# Patient Record
Sex: Female | Born: 2000 | Race: Black or African American | Hispanic: No | Smoking: Former smoker
Health system: Southern US, Community
[De-identification: ages and names within clinical notes are randomized; demographics above are authoritative.]

## PROBLEM LIST (undated history)

## (undated) DIAGNOSIS — D649 Anemia, unspecified: Secondary | ICD-10-CM

## (undated) HISTORY — DX: Anemia, unspecified: D64.9

---

## 2019-07-19 ENCOUNTER — Other Ambulatory Visit: Payer: Self-pay

## 2019-07-19 ENCOUNTER — Encounter: Payer: Self-pay | Admitting: Emergency Medicine

## 2019-07-19 ENCOUNTER — Ambulatory Visit
Admission: EM | Admit: 2019-07-19 | Discharge: 2019-07-19 | Disposition: A | Discharge status: Home / Self Care | Payer: Self-pay | Attending: Physician Assistant | Admitting: Physician Assistant

## 2019-07-19 DIAGNOSIS — R05 Cough: Secondary | ICD-10-CM | POA: Insufficient documentation

## 2019-07-19 DIAGNOSIS — R059 Cough, unspecified: Secondary | ICD-10-CM

## 2019-07-19 DIAGNOSIS — F17200 Nicotine dependence, unspecified, uncomplicated: Secondary | ICD-10-CM

## 2019-07-19 DIAGNOSIS — J029 Acute pharyngitis, unspecified: Secondary | ICD-10-CM | POA: Insufficient documentation

## 2019-07-19 DIAGNOSIS — Z20822 Contact with and (suspected) exposure to covid-19: Secondary | ICD-10-CM

## 2019-07-19 LAB — POCT RAPID STREP A (OFFICE): Rapid Strep A Screen: NEGATIVE

## 2019-07-19 MED ORDER — BENZONATATE 200 MG PO CAPS: 200.0000 mg | ORAL_CAPSULE | Freq: Three times a day (TID) | ORAL | 0 refills | Status: DC

## 2019-07-19 MED ORDER — IPRATROPIUM BROMIDE 0.06 % NA SOLN: 2.0000 | Freq: Four times a day (QID) | NASAL | 0 refills | Status: DC

## 2019-07-19 NOTE — ED Triage Notes (Signed)
Pt here for sore throat and body aches with cough x 5 days; denies fever

## 2019-07-19 NOTE — Discharge Instructions (Signed)
COVID PCR testing ordered. I would like you to quarantine until testing results. You can take over the counter flonase/nasacort to help with nasal congestion/drainage. Tylenol/motrin for pain and fever. Keep hydrated, urine should be clear to pale yellow in color. If experiencing shortness of breath, trouble breathing, go to the emergency department for further evaluation needed.  

## 2019-07-19 NOTE — ED Provider Notes (Signed)
[facility]    CSN: [id number] Arrival date & time: 07/19/19  1130      History   Chief Complaint Chief Complaint  Patient presents with  . Sore Throat    HPI Mindy Key is a 19 y.o. female.   19 year old female comes in for 5 day of URI symptoms. Sore throat, body aches, cough. Cough was at first nonproductive, has been productive for the past 2 days. Denies fever, chills. Denies abdominal pain, nausea, vomiting, diarrhea. Denies shortness of breath, loss of taste/smell. otc cold medicine with some relief. Current some day smoker, 1 black and mile. No history of inhaler use.      History reviewed. No pertinent past medical history.  There are no problems to display for this patient.   History reviewed. No pertinent surgical history.  OB History   No obstetric history on file.      Home Medications    Prior to Admission medications   Medication Sig Start Date End Date Taking? Authorizing Provider  benzonatate (TESSALON) 200 MG capsule Take 1 capsule (200 mg total) by mouth every 8 (eight) hours. 07/19/19   Tasia Catchings, Ramari Bray V, PA-C  ipratropium (ATROVENT) 0.06 % nasal spray Place 2 sprays into both nostrils 4 (four) times daily. 07/19/19   Ok Edwards, PA-C    Family History Family History  Problem Relation Age of Onset  . Healthy Mother   . Healthy Father     Social History Social History   Tobacco Use  . Smoking status: Current Every Day Smoker  . Smokeless tobacco: Never Used  Substance Use Topics  . Alcohol use: Yes  . Drug use: Never     Allergies   Patient has no known allergies.   Review of Systems Review of Systems  Reason unable to perform ROS: See HPI as above.     Physical Exam Triage Vital Signs ED Triage Vitals  Enc Vitals Group     BP 07/19/19 1219 103/63     Pulse Rate 07/19/19 1219 73     Resp 07/19/19 1219 18     Temp 07/19/19 1219 98.1 F (36.7 C)     Temp Source 07/19/19 1219 Oral     SpO2 07/19/19 1219 100 %   Weight --      Height --      Head Circumference --      Peak Flow --      Pain Score 07/19/19 1220 5     Pain Loc --      Pain Edu? --      Excl. in [place]? --    No data found.  Updated Vital Signs BP 103/63 (BP Location: Right Arm)   Pulse 73   Temp 98.1 F (36.7 C) (Oral)   Resp 18   SpO2 100%   Physical Exam Constitutional:      General: She is not in acute distress.    Appearance: Normal appearance. She is not ill-appearing, toxic-appearing or diaphoretic.  HENT:     Head: Normocephalic and atraumatic.     Right Ear: Tympanic membrane and ear canal normal. Tympanic membrane is not erythematous or bulging.     Left Ear: Tympanic membrane and ear canal normal. Tympanic membrane is not erythematous or bulging.     Nose:     Right Sinus: No maxillary sinus tenderness or frontal sinus tenderness.     Left Sinus: No maxillary sinus tenderness or frontal sinus tenderness.  Mouth/Throat:     Mouth: Mucous membranes are moist.     Pharynx: Oropharynx is clear. Uvula midline.     Tonsils: 2+ on the right. 2+ on the left.  Cardiovascular:     Rate and Rhythm: Normal rate and regular rhythm.     Heart sounds: Normal heart sounds. No murmur. No friction rub. No gallop.   Pulmonary:     Effort: Pulmonary effort is normal. No accessory muscle usage, prolonged expiration, respiratory distress or retractions.     Comments: Lungs clear to auscultation without adventitious lung sounds. Musculoskeletal:     Cervical back: Normal range of motion and neck supple.  Neurological:     General: No focal deficit present.     Mental Status: She is alert and oriented to person, place, and time.      UC Treatments / Results  Labs (all labs ordered are listed, but only abnormal results are displayed) Labs Reviewed  POCT RAPID STREP A (OFFICE) - Normal  NOVEL CORONAVIRUS, NAA  CULTURE, GROUP A STREP [facility])    EKG   Radiology No results found.  Procedures Procedures (including  critical care time)  Medications Ordered in UC Medications - No data to display  Initial Impression / Assessment and Plan / UC Course  I have reviewed the triage vital signs and the nursing notes.  Pertinent labs & imaging results that were available during my care of the patient were reviewed by me and considered in my medical decision making (see chart for details).    Rapid strep negative. COVID PCR test ordered. Patient to quarantine until testing results return. No alarming signs on exam.  Patient speaking in full sentences without respiratory distress. LCTAB. Symptomatic treatment discussed.  Push fluids.  Return precautions given.  Patient expresses understanding and agrees to plan.  Given smoking history, now with worsening cough, if symptomatic treatment not improving, can do short course prednisone if needed.  Final Clinical Impressions(s) / UC Diagnoses   Final diagnoses:  Cough  Sore throat    ED Prescriptions    Medication Sig Dispense Auth. Provider   benzonatate (TESSALON) 200 MG capsule Take 1 capsule (200 mg total) by mouth every 8 (eight) hours. 21 capsule Anastasio Wogan V, PA-C   ipratropium (ATROVENT) 0.06 % nasal spray Place 2 sprays into both nostrils 4 (four) times daily. 15 mL Belinda Fisher, PA-C     PDMP not reviewed this encounter.   Belinda Fisher, PA-C 07/19/19 1237

## 2019-07-20 LAB — NOVEL CORONAVIRUS, NAA: SARS-CoV-2, NAA: NOT DETECTED

## 2019-07-22 LAB — CULTURE, GROUP A STREP (THRC)

## 2021-01-31 ENCOUNTER — Ambulatory Visit: Payer: Medicaid Other | Admitting: Obstetrics & Gynecology

## 2021-02-11 ENCOUNTER — Encounter (HOSPITAL_COMMUNITY): Payer: Self-pay | Admitting: Emergency Medicine

## 2021-02-11 ENCOUNTER — Other Ambulatory Visit: Payer: Self-pay

## 2021-02-11 ENCOUNTER — Emergency Department (HOSPITAL_COMMUNITY)
Admission: EM | Admit: 2021-02-11 | Discharge: 2021-02-11 | Disposition: A | Discharge status: Home / Self Care | Payer: Medicaid Other | Attending: Emergency Medicine | Admitting: Emergency Medicine

## 2021-02-11 DIAGNOSIS — F17200 Nicotine dependence, unspecified, uncomplicated: Secondary | ICD-10-CM | POA: Insufficient documentation

## 2021-02-11 DIAGNOSIS — L0291 Cutaneous abscess, unspecified: Secondary | ICD-10-CM

## 2021-02-11 DIAGNOSIS — L02212 Cutaneous abscess of back [any part, except buttock]: Secondary | ICD-10-CM | POA: Insufficient documentation

## 2021-02-11 LAB — POC URINE PREG, ED: Preg Test, Ur: NEGATIVE

## 2021-02-11 MED ORDER — AMOXICILLIN-POT CLAVULANATE 875-125 MG PO TABS: 1.0000 | ORAL_TABLET | Freq: Two times a day (BID) | ORAL | 0 refills | Status: AC

## 2021-02-11 NOTE — ED Triage Notes (Signed)
Pt c/o low back pain and "knots". Denies fever.

## 2021-02-11 NOTE — ED Provider Notes (Signed)
[facility] EMERGENCY DEPARTMENT Provider Note   CSN: [id number] Arrival date & time: 02/11/21  1446     History Chief Complaint  Patient presents with   Back Pain    Mindy Key is a 20 y.o. female.  This is a 20 y.o. female without significant medical history who presents to the ED with complaint of back pain.   Location:  left of midline to lumbar spine Duration:  1 week Onset:  at rest Timing:  constant, mildly worsened Description:  sharp, aching Severity:  mild Exacerbating/Alleviating Factors:  direct palpation, squeezing of lesion Associated Symptoms:  none Pertinent Negatives:  no fevers, chills, nausea or emesis, no change to bowel or bladder activity, no gait disturbances, no IVDU. Not a/w trauma. No numbness or weakness. No saddle paresthesias or urinary overflow/bowel incontinence. No fresh water exposure.   She also reports squeezing the lesion 1-2 days ago and noticed some mild purulent drainage, minimal bleeding.    The history is provided by the patient. No language interpreter was used.  Back Pain Associated symptoms: no abdominal pain, no chest pain, no fever and no headaches       History reviewed. No pertinent past medical history.  There are no problems to display for this patient.   History reviewed. No pertinent surgical history.   OB History   No obstetric history on file.     Family History  Problem Relation Age of Onset   Healthy Mother    Healthy Father     Social History   Tobacco Use   Smoking status: Every Day   Smokeless tobacco: Never  Substance Use Topics   Alcohol use: Yes   Drug use: Never    Home Medications Prior to Admission medications   Medication Sig Start Date End Date Taking? Authorizing Provider  amoxicillin-clavulanate (AUGMENTIN) 875-125 MG tablet Take 1 tablet by mouth every 12 (twelve) hours for 7 days. 02/11/21 02/18/21 Yes Sloan Leiter, DO  benzonatate (TESSALON) 200 MG capsule  Take 1 capsule (200 mg total) by mouth every 8 (eight) hours. 07/19/19   Cathie Hoops, Amy V, PA-C  ipratropium (ATROVENT) 0.06 % nasal spray Place 2 sprays into both nostrils 4 (four) times daily. 07/19/19   Belinda Fisher, PA-C    Allergies    Patient has no known allergies.  Review of Systems   Review of Systems  Constitutional:  Negative for chills and fever.  HENT:  Negative for facial swelling and trouble swallowing.   Eyes:  Negative for photophobia and visual disturbance.  Respiratory:  Negative for cough and shortness of breath.   Cardiovascular:  Negative for chest pain and palpitations.  Gastrointestinal:  Negative for abdominal pain, nausea and vomiting.  Endocrine: Negative for polydipsia and polyuria.  Genitourinary:  Negative for difficulty urinating and hematuria.  Musculoskeletal:  Positive for back pain. Negative for gait problem and joint swelling.  Skin:  Positive for wound. Negative for pallor and rash.  Neurological:  Negative for syncope and headaches.  Psychiatric/Behavioral:  Negative for agitation and confusion.    Physical Exam Updated Vital Signs BP 119/70 (BP Location: Left Arm)   Pulse 84   Temp 98.5 F (36.9 C) (Oral)   Resp 16   SpO2 97%   Physical Exam Vitals and nursing note reviewed.  Constitutional:      General: She is not in acute distress.    Appearance: Normal appearance.  HENT:     Head: Normocephalic and atraumatic.  Right Ear: External ear normal.     Left Ear: External ear normal.     Nose: Nose normal.     Mouth/Throat:     Mouth: Mucous membranes are moist.  Eyes:     General: No scleral icterus.       Right eye: No discharge.        Left eye: No discharge.     Extraocular Movements: Extraocular movements intact.     Pupils: Pupils are equal, round, and reactive to light.  Cardiovascular:     Rate and Rhythm: Normal rate and regular rhythm.     Pulses: Normal pulses.     Heart sounds: Normal heart sounds.  Pulmonary:     Effort:  Pulmonary effort is normal. No respiratory distress.     Breath sounds: Normal breath sounds.  Abdominal:     General: Abdomen is flat.     Tenderness: There is no abdominal tenderness.  Musculoskeletal:        General: Normal range of motion.     Cervical back: Normal range of motion.       Back:     Right lower leg: No edema.     Left lower leg: No edema.  Skin:    General: Skin is warm and dry.     Capillary Refill: Capillary refill takes less than 2 seconds.  Neurological:     Mental Status: She is alert and oriented to person, place, and time.     GCS: GCS eye subscore is 4. GCS verbal subscore is 5. GCS motor subscore is 6.     Cranial Nerves: Cranial nerves are intact. No cranial nerve deficit.     Sensory: Sensation is intact.     Motor: Motor function is intact. No tremor.     Coordination: Coordination is intact.     Gait: Gait is intact.  Psychiatric:        Mood and Affect: Mood normal.        Behavior: Behavior normal.    ED Results / Procedures / Treatments   Labs (all labs ordered are listed, but only abnormal results are displayed) Labs Reviewed  POC URINE PREG, ED    EKG None  Radiology No results found.  Procedures Procedures   Medications Ordered in ED Medications - No data to display  ED Course  I have reviewed the triage vital signs and the nursing notes.  Pertinent labs & imaging results that were available during my care of the patient were reviewed by me and considered in my medical decision making (see chart for details).    MDM Rules/Calculators/A&P                            Patient presents with an abscess, located on the lower back. Vital signs reviewed and are stable. Afebrile. Neuro exam is non-focal. Abscess is not midline and does not track towards the spine. Appears more superficial with minimal area of fluctuance surrounding the draining abscess. No cellulitic changes, no streaking from the wound. No IVDU. No hx spinal  injections. No reported injury to her lower back. Neurological exam is non-focal. Serious etiology was considered.  As wound already draining in the ED will not perform I&D at this time. Patient was instructed on importance of Abx and the need for a wound check either with PCP or to return to ED for reevaluation of wound. Patient told keep wound clean and dry  for the next 48 hours.   The patient has been instructed to return immediately if the symptoms worsen in any way for re-evaluation. Patient verbalized understanding and is in agreement with current care plan. All questions answered prior to discharge.   Final Clinical Impression(s) / ED Diagnoses Final diagnoses:  Abscess    Rx / DC Orders ED Discharge Orders          Ordered    POCT urine pregnancy        02/11/21 1635    amoxicillin-clavulanate (AUGMENTIN) 875-125 MG tablet  Every 12 hours        02/11/21 1744             Sloan Leiter, DO 02/11/21 1751

## 2021-02-11 NOTE — Discharge Instructions (Addendum)
Please take tylenol/motrin every 4-6 hours as needed for discomfort.

## 2021-02-23 ENCOUNTER — Encounter (HOSPITAL_COMMUNITY): Payer: Self-pay | Admitting: Emergency Medicine

## 2021-02-23 ENCOUNTER — Emergency Department (HOSPITAL_COMMUNITY)
Admission: EM | Admit: 2021-02-23 | Discharge: 2021-02-23 | Disposition: A | Discharge status: Home / Self Care | Payer: Medicaid Other | Attending: Emergency Medicine | Admitting: Emergency Medicine

## 2021-02-23 ENCOUNTER — Other Ambulatory Visit: Payer: Self-pay

## 2021-02-23 DIAGNOSIS — L292 Pruritus vulvae: Secondary | ICD-10-CM | POA: Insufficient documentation

## 2021-02-23 DIAGNOSIS — F17200 Nicotine dependence, unspecified, uncomplicated: Secondary | ICD-10-CM | POA: Insufficient documentation

## 2021-02-23 DIAGNOSIS — N898 Other specified noninflammatory disorders of vagina: Secondary | ICD-10-CM

## 2021-02-23 LAB — WET PREP, GENITAL
Clue Cells Wet Prep HPF POC: NONE SEEN
Sperm: NONE SEEN
Trich, Wet Prep: NONE SEEN
Yeast Wet Prep HPF POC: NONE SEEN

## 2021-02-23 LAB — URINALYSIS, ROUTINE W REFLEX MICROSCOPIC
Bilirubin Urine: NEGATIVE
Glucose, UA: NEGATIVE mg/dL
Ketones, ur: NEGATIVE mg/dL
Nitrite: NEGATIVE
Protein, ur: NEGATIVE mg/dL
Specific Gravity, Urine: 1.01 (ref 1.005–1.030)
pH: 6 (ref 5.0–8.0)

## 2021-02-23 LAB — PREGNANCY, URINE: Preg Test, Ur: NEGATIVE

## 2021-02-23 MED ORDER — CEFTRIAXONE SODIUM 500 MG IJ SOLR
500.0000 mg | Freq: Once | INTRAMUSCULAR | Status: AC
  Administered 2021-02-23: 500 mg via INTRAMUSCULAR
  Filled 2021-02-23: qty 500

## 2021-02-23 MED ORDER — LIDOCAINE HCL (PF) 1 % IJ SOLN
1.0000 mL | Freq: Once | INTRAMUSCULAR | Status: AC
  Administered 2021-02-23: 1 mL

## 2021-02-23 MED ORDER — DOXYCYCLINE HYCLATE 100 MG PO TABS: 100.0000 mg | ORAL_TABLET | Freq: Two times a day (BID) | ORAL | 0 refills | Status: DC

## 2021-02-23 MED ORDER — METRONIDAZOLE 500 MG PO TABS: 500.0000 mg | ORAL_TABLET | Freq: Two times a day (BID) | ORAL | 0 refills | Status: DC

## 2021-02-23 NOTE — ED Triage Notes (Signed)
Patient reports vaginal irritation with urinary frequency onset last week , denies vaginal discharge or fever .

## 2021-02-23 NOTE — Discharge Instructions (Addendum)
Please return if your symptoms worsen or fail to improve. I would refrain from sexual activity until your symptoms have fully resolved or until you have completed your antibiotics. Please finish your entire course of antibiotics.  You can check the results of your STI screen on your portal in around 24 hours.

## 2021-02-23 NOTE — ED Provider Notes (Signed)
[facility] EMERGENCY DEPARTMENT Provider Note   CSN: [id number] Arrival date & time: 02/23/21  0210     History Chief Complaint  Patient presents with   Urinary Frequency / Vaginal Irritation     Mindy Key is a 20 y.o. female significant for previous treatment for trichomoniasis and chlamydia who presents with 1 week of vaginal irritation, without discharge.  Patient reports that she is sexually active with 1 partner, has been sexually active with 1 partner for the last 3 months.  Patient denies dyspareunia.  Patient denies any foul odor.  Patient reports that her last menstrual period was this week, she just finished.  Patient denies gross hematuria in urine.  Patient denies history of nephrolithiasis.  Patient denies fever, constipation, diarrhea, abdominal discomfort, nausea or vomiting.  Patient reports her sexual partner has not had any symptoms, has not been tested recently.  HPI     History reviewed. No pertinent past medical history.  There are no problems to display for this patient.   History reviewed. No pertinent surgical history.   OB History   No obstetric history on file.     Family History  Problem Relation Age of Onset   Healthy Mother    Healthy Father     Social History   Tobacco Use   Smoking status: Every Day   Smokeless tobacco: Never  Substance Use Topics   Alcohol use: Yes   Drug use: Never    Home Medications Prior to Admission medications   Medication Sig Start Date End Date Taking? Authorizing Provider  doxycycline (VIBRA-TABS) 100 MG tablet Take 1 tablet (100 mg total) by mouth 2 (two) times daily. 02/23/21  Yes Jettie Lazare H, PA-C  metroNIDAZOLE (FLAGYL) 500 MG tablet Take 1 tablet (500 mg total) by mouth 2 (two) times daily. 02/23/21  Yes Satchel Heidinger H, PA-C  benzonatate (TESSALON) 200 MG capsule Take 1 capsule (200 mg total) by mouth every 8 (eight) hours. 07/19/19   Cathie Hoops, Amy V, PA-C  ipratropium  (ATROVENT) 0.06 % nasal spray Place 2 sprays into both nostrils 4 (four) times daily. 07/19/19   Belinda Fisher, PA-C    Allergies    Patient has no known allergies.  Review of Systems   Review of Systems  Genitourinary:        Vaginal itching  All other systems reviewed and are negative.  Physical Exam Updated Vital Signs BP (!) 117/57 (BP Location: Right Arm)   Pulse 71   Temp 98.7 F (37.1 C) (Oral)   Resp 15   Ht 5\' 3"  (1.6 m)   Wt 75 kg   LMP 02/18/2021   SpO2 100%   BMI 29.29 kg/m   Physical Exam Vitals and nursing note reviewed.  Constitutional:      General: She is not in acute distress.    Appearance: Normal appearance.  HENT:     Head: Normocephalic and atraumatic.  Eyes:     General:        Right eye: No discharge.        Left eye: No discharge.  Cardiovascular:     Rate and Rhythm: Normal rate and regular rhythm.  Pulmonary:     Effort: Pulmonary effort is normal. No respiratory distress.  Abdominal:     Tenderness: There is no abdominal tenderness.     Comments: No RUQ tenderness  Genitourinary:    General: Normal vulva.     Comments: Some whitish discharge from multiparous cervical  os, no lesions noted on Os or in vaginal vault. CMT on bimanual. No masses palpated. Musculoskeletal:        General: No deformity.  Skin:    General: Skin is warm and dry.  Neurological:     Mental Status: She is alert and oriented to person, place, and time.  Psychiatric:        Mood and Affect: Mood normal.        Behavior: Behavior normal.    ED Results / Procedures / Treatments   Labs (all labs ordered are listed, but only abnormal results are displayed) Labs Reviewed  WET PREP, GENITAL - Abnormal; Notable for the following components:      Result Value   WBC, Wet Prep HPF POC MANY (*)    All other components within normal limits  URINALYSIS, ROUTINE W REFLEX MICROSCOPIC - Abnormal; Notable for the following components:   APPearance HAZY (*)    Hgb urine  dipstick MODERATE (*)    Leukocytes,Ua MODERATE (*)    Bacteria, UA RARE (*)    All other components within normal limits  PREGNANCY, URINE  GC/CHLAMYDIA PROBE AMP ([place]) NOT AT [facility]    EKG None  Radiology No results found.  Procedures Procedures   Medications Ordered in ED Medications  cefTRIAXone (ROCEPHIN) injection 500 mg (500 mg Intramuscular Given 02/23/21 1429)  lidocaine (PF) (XYLOCAINE) 1 % injection 1 mL (1 mL Other Given 02/23/21 1429)    ED Course  I have reviewed the triage vital signs and the nursing notes.  Pertinent labs & imaging results that were available during my care of the patient were reviewed by me and considered in my medical decision making (see chart for details).    MDM Rules/Calculators/A&P                         Patient with history of chlamydia, trichomoniasis previously treated.  Patient with new vaginal itching, some bacteria and leukocytes in urinalysis, and CMT on bimanual exam.  Cervical discharge is not clearly consistent with chlamydia or gonorrhea.  Had discussion with patient on whether to wait for results or treat presumptively given CMT for chlamydia, gonorrhea at this time.  Patient would like to treat presumptively at this time. Wet prep shows WBCs. Patient has no right upper quadrant tenderness, minimal clinical concern for Fitz-Hugh Curtis syndrome.  Patient has no adnexal tenderness, no fever, no signs of sepsis or systemic infection, minimal clinical concern for tubo-ovarian abscess at this time.  Patient discharged in stable condition.  Return precautions were given.  Patient educated to hold off on sexual practices until symptoms resolve or until antibiotics completely finished. Final Clinical Impression(s) / ED Diagnoses Final diagnoses:  Vaginal itching    Rx / DC Orders ED Discharge Orders          Ordered    doxycycline (VIBRA-TABS) 100 MG tablet  2 times daily        02/23/21 1412    metroNIDAZOLE (FLAGYL) 500  MG tablet  2 times daily        02/23/21 1412             Woodward Klem, [place] H, PA-C 02/23/21 1439    Alvira Monday, MD 02/25/21 [phone]

## 2021-02-23 NOTE — ED Provider Notes (Signed)
Emergency Medicine Provider Triage Evaluation Note  Mindy Key , a 20 y.o. female  was evaluated in triage.  Pt complains of vaginal irritation x 1 week. Reports itchiness & irritation with urinary frequency. No alleviating/aggravating factors.   Review of Systems  Positive: Vaginal irritation/pruritus, urinary frequency Negative: Dysuria, fever, vomiting, abdominal pain, vaginal bleeding/discharge.   Physical Exam  Ht 5\' 3"  (1.6 m)   Wt 75 kg   LMP 02/18/2021   BMI 29.29 kg/m  Gen:   Awake, no distress   Resp:  Normal effort  MSK:   Moves extremities without difficulty  Other:  Abdomen nontender. GU exam deferred in triage setting.   Medical Decision Making  Medically screening exam initiated at 2:43 AM.  Appropriate orders placed.  [name] was informed that the remainder of the evaluation will be completed by another provider, this initial triage assessment does not replace that evaluation, and the importance of remaining in the ED until their evaluation is complete.  Vaginal irritation.    [name], PA-C 02/23/21 0245    02/25/21, MD 02/23/21 [phone]

## 2021-02-24 LAB — GC/CHLAMYDIA PROBE AMP (~~LOC~~) NOT AT ARMC
Chlamydia: NEGATIVE
Comment: NEGATIVE
Comment: NORMAL
Neisseria Gonorrhea: NEGATIVE

## 2021-06-02 ENCOUNTER — Ambulatory Visit (HOSPITAL_COMMUNITY)
Admission: EM | Admit: 2021-06-02 | Discharge: 2021-06-02 | Disposition: A | Discharge status: Home / Self Care | Payer: Medicaid Other | Attending: Emergency Medicine | Admitting: Emergency Medicine

## 2021-06-02 ENCOUNTER — Other Ambulatory Visit: Payer: Self-pay

## 2021-06-02 ENCOUNTER — Encounter (HOSPITAL_COMMUNITY): Payer: Self-pay | Admitting: Emergency Medicine

## 2021-06-02 DIAGNOSIS — N898 Other specified noninflammatory disorders of vagina: Secondary | ICD-10-CM | POA: Diagnosis present

## 2021-06-02 DIAGNOSIS — R590 Localized enlarged lymph nodes: Secondary | ICD-10-CM | POA: Insufficient documentation

## 2021-06-02 MED ORDER — METHYLPREDNISOLONE SODIUM SUCC 125 MG IJ SOLR
INTRAMUSCULAR | Status: AC
  Filled 2021-06-02: qty 2

## 2021-06-02 MED ORDER — LIDOCAINE VISCOUS HCL 2 % MT SOLN: 15.0000 mL | OROMUCOSAL | 0 refills | Status: DC | PRN

## 2021-06-02 MED ORDER — FLUCONAZOLE 150 MG PO TABS: 150.0000 mg | ORAL_TABLET | Freq: Every day | ORAL | 0 refills | Status: AC

## 2021-06-02 MED ORDER — IBUPROFEN 800 MG PO TABS: 800.0000 mg | ORAL_TABLET | Freq: Three times a day (TID) | ORAL | 0 refills | Status: DC

## 2021-06-02 MED ORDER — METHYLPREDNISOLONE SODIUM SUCC 125 MG IJ SOLR
60.0000 mg | Freq: Once | INTRAMUSCULAR | Status: AC
  Administered 2021-06-02: 60 mg via INTRAMUSCULAR

## 2021-06-02 NOTE — ED Provider Notes (Signed)
[facility]    CSN: [id number] Arrival date & time: 06/02/21  1355      History   Chief Complaint Chief Complaint  Patient presents with   Neck Pain    HPI Mindy Key is a 21 y.o. female.   Patient concerned with knot in the left side of neck for 4 days.  Endorses it has fluctuated in size.  Site is painful.  Denies drainage, fever, chills, nasal congestion, rhinorrhea, sore throat, difficulty swallowing, itchy throat.  Has not attempted treatment of symptoms.  Concerned with Taevin Mcferran thick vaginal discharge with associated itching and irritation for 5 days.  Has not attempted treatment for symptoms.  No known exposure.  Sexually active, 1 partner, no condom use.  Denies vaginal odor, urinary frequency or urgency, dysuria, hematuria, abdominal pain, flank pain.   History reviewed. No pertinent past medical history.  There are no problems to display for this patient.   History reviewed. No pertinent surgical history.  OB History   No obstetric history on file.      Home Medications    Prior to Admission medications   Medication Sig Start Date End Date Taking? Authorizing Provider  benzonatate (TESSALON) 200 MG capsule Take 1 capsule (200 mg total) by mouth every 8 (eight) hours. 07/19/19   Cathie Hoops, Amy V, PA-C  doxycycline (VIBRA-TABS) 100 MG tablet Take 1 tablet (100 mg total) by mouth 2 (two) times daily. 02/23/21   Prosperi, Christian H, PA-C  ipratropium (ATROVENT) 0.06 % nasal spray Place 2 sprays into both nostrils 4 (four) times daily. 07/19/19   Belinda Fisher, PA-C    Family History Family History  Problem Relation Age of Onset   Healthy Mother    Healthy Father     Social History Social History   Tobacco Use   Smoking status: Every Day   Smokeless tobacco: Never  Substance Use Topics   Alcohol use: Yes   Drug use: Never     Allergies   Patient has no known allergies.   Review of Systems Review of Systems  Constitutional: Negative.   HENT:  Negative.    Genitourinary:  Positive for vaginal discharge. Negative for decreased urine volume, difficulty urinating, dyspareunia, dysuria, enuresis, flank pain, frequency, genital sores, hematuria, menstrual problem, pelvic pain, urgency, vaginal bleeding and vaginal pain.  Musculoskeletal:  Positive for neck pain. Negative for arthralgias, back pain, gait problem, joint swelling, myalgias and neck stiffness.  Skin: Negative.     Physical Exam Triage Vital Signs ED Triage Vitals  Enc Vitals Group     BP 06/02/21 1512 101/67     Pulse Rate 06/02/21 1512 82     Resp 06/02/21 1512 17     Temp 06/02/21 1512 98.3 F (36.8 C)     Temp Source 06/02/21 1512 Oral     SpO2 06/02/21 1512 99 %     Weight --      Height --      Head Circumference --      Peak Flow --      Pain Score 06/02/21 1509 8     Pain Loc --      Pain Edu? --      Excl. in GC? --    No data found.  Updated Vital Signs BP 101/67    Pulse 82    Temp 98.3 F (36.8 C) (Oral)    Resp 17    SpO2 99%   Visual Acuity Right Eye Distance:  Left Eye Distance:   Bilateral Distance:    Right Eye Near:   Left Eye Near:    Bilateral Near:     Physical Exam Constitutional:      Appearance: Normal appearance. She is normal weight.  HENT:     Mouth/Throat:     Pharynx: Oropharynx is clear. Uvula midline. No oropharyngeal exudate or posterior oropharyngeal erythema.     Tonsils: No tonsillar exudate. 2+ on the right. 2+ on the left.  Eyes:     Extraocular Movements: Extraocular movements intact.  Pulmonary:     Effort: Pulmonary effort is normal.  Genitourinary:    Comments: Deferred self collect vaginal swab Lymphadenopathy:     Cervical: Cervical adenopathy present.     Left cervical: Posterior cervical adenopathy present. No superficial or deep cervical adenopathy.  Skin:    General: Skin is warm and dry.  Neurological:     Mental Status: She is alert and oriented to person, place, and time. Mental status is  at baseline.  Psychiatric:        Mood and Affect: Mood normal.        Behavior: Behavior normal.     UC Treatments / Results  Labs (all labs ordered are listed, but only abnormal results are displayed) Labs Reviewed  CERVICOVAGINAL ANCILLARY ONLY    EKG   Radiology No results found.  Procedures Procedures (including critical care time)  Medications Ordered in UC Medications - No data to display  Initial Impression / Assessment and Plan / UC Course  I have reviewed the triage vital signs and the nursing notes.  Pertinent labs & imaging results that were available during my care of the patient were reviewed by me and considered in my medical decision making (see chart for details).  Posterior cervical adenopathy Vaginal discharge  1.  Methylprednisolone 60 mg IM now to help reduce swelling, ibuprofen 800 mg 3 times daily as needed, lidocaine viscous 2% 15 mils every 4 hours as needed, to monitor site, may return for persistent symptoms 2.  Symptomology is consistent with a yeast infection, will prescribe Diflucan, STI screening pending, will treat per protocol, advised abstinence until labs result, treatment is complete and all symptoms have resolved, advised condom use moving forward after all sexual encounters, UC follow up as needed Final Clinical Impressions(s) / UC Diagnoses   Final diagnoses:  None   Discharge Instructions   None    ED Prescriptions   None    PDMP not reviewed this encounter.   Hans Eden, NP 06/02/21 1538

## 2021-06-02 NOTE — ED Triage Notes (Signed)
Pt c/o knot on left side of neck for couple days that is painful.  Vaginal discharge and irritation for a couple days. Discharge is white.

## 2021-06-02 NOTE — Discharge Instructions (Signed)
The area of concern on your neck appears to be a lymph node which is swollen, this typically will resolve itself, you are being given an injection here in the office to help reduce swelling  In addition you may use ibuprofen every 8 hours as needed to help with the pain this medicine will also help to reduce swelling  You may gargle and spit lidocaine solution every 4 hours as needed for temporary relief  You may hold warm compresses to the affected area for comfort  Your vaginal symptoms today are consistent with yeast, take 1 Diflucan pill then in 3 days if still having symptoms may take second dose  Labs pending 2-3 days, you will be contacted if positive for any sti and treatment will be sent to the pharmacy, you will have to return to the clinic if positive for gonorrhea to receive treatment   Please refrain from having sex until labs results, if positive please refrain from having sex until treatment complete and symptoms resolve   If positive for Chlamydia  gonorrhea or trichomoniasis please notify partner or partners so they may tested as well  Moving forward, it is recommended you use some form of protection against the transmission of sti infections  such as condoms or dental dams with each sexual encounter

## 2021-06-05 ENCOUNTER — Telehealth (HOSPITAL_COMMUNITY): Payer: Self-pay | Admitting: Emergency Medicine

## 2021-06-05 LAB — CERVICOVAGINAL ANCILLARY ONLY
Bacterial Vaginitis (gardnerella): POSITIVE — AB
Candida Glabrata: NEGATIVE
Candida Vaginitis: POSITIVE — AB
Chlamydia: NEGATIVE
Comment: NEGATIVE
Comment: NEGATIVE
Comment: NEGATIVE
Comment: NEGATIVE
Comment: NEGATIVE
Comment: NORMAL
Neisseria Gonorrhea: NEGATIVE
Trichomonas: NEGATIVE

## 2021-06-05 MED ORDER — METRONIDAZOLE 500 MG PO TABS: 500.0000 mg | ORAL_TABLET | Freq: Two times a day (BID) | ORAL | 0 refills | Status: DC

## 2021-06-09 ENCOUNTER — Encounter (HOSPITAL_COMMUNITY): Payer: Self-pay

## 2021-06-09 ENCOUNTER — Ambulatory Visit (HOSPITAL_COMMUNITY)
Admission: EM | Admit: 2021-06-09 | Discharge: 2021-06-09 | Disposition: A | Discharge status: Home / Self Care | Payer: Medicaid Other | Attending: Urgent Care | Admitting: Urgent Care

## 2021-06-09 ENCOUNTER — Other Ambulatory Visit: Payer: Self-pay

## 2021-06-09 DIAGNOSIS — B9689 Other specified bacterial agents as the cause of diseases classified elsewhere: Secondary | ICD-10-CM | POA: Diagnosis not present

## 2021-06-09 DIAGNOSIS — Z3201 Encounter for pregnancy test, result positive: Secondary | ICD-10-CM

## 2021-06-09 DIAGNOSIS — L03317 Cellulitis of buttock: Secondary | ICD-10-CM

## 2021-06-09 DIAGNOSIS — N760 Acute vaginitis: Secondary | ICD-10-CM

## 2021-06-09 LAB — POCT URINALYSIS DIPSTICK, ED / UC
Glucose, UA: NEGATIVE mg/dL
Ketones, ur: 40 mg/dL — AB
Nitrite: NEGATIVE
Protein, ur: 30 mg/dL — AB
Specific Gravity, Urine: >=1.03 (ref 1.005–1.030)
Urobilinogen, UA: 4 mg/dL — ABNORMAL HIGH (ref 0.0–1.0)
pH: 5.5 (ref 5.0–8.0)

## 2021-06-09 LAB — POC URINE PREG, ED: Preg Test, Ur: POSITIVE — AB

## 2021-06-09 MED ORDER — CLINDAMYCIN HCL 300 MG PO CAPS: 300.0000 mg | ORAL_CAPSULE | Freq: Three times a day (TID) | ORAL | 0 refills | Status: DC

## 2021-06-09 NOTE — ED Provider Notes (Signed)
[facility]   MRN: [id number] DOB: 2000/12/25  Subjective:   Mindy Key is a 21 y.o. female presenting for 3-day history of acute onset persistent and worsening right-sided buttock pain with swelling.  She would also like to be tested for pregnancy.  Reports that she took 3 pregnancy tests at home and all were positive.  She is concerned because she was recently prescribed antibiotics and does not know if she is safe to take them.  Denies fever, nausea, vomiting, dysuria, hematuria, vaginal bleeding, pelvic pain.  She did call and set up an OB appointment for next week.  She was last seen 06/05/2021.  Ultimately her tests from that day resulted in a bacterial vaginosis.  She was prescribed metronidazole which she has not taken out of fear for pregnancy.  No current facility-administered medications for this encounter.  Current Outpatient Medications:    benzonatate (TESSALON) 200 MG capsule, Take 1 capsule (200 mg total) by mouth every 8 (eight) hours., Disp: 21 capsule, Rfl: 0   doxycycline (VIBRA-TABS) 100 MG tablet, Take 1 tablet (100 mg total) by mouth 2 (two) times daily., Disp: 14 tablet, Rfl: 0   ibuprofen (ADVIL) 800 MG tablet, Take 1 tablet (800 mg total) by mouth 3 (three) times daily., Disp: 21 tablet, Rfl: 0   ipratropium (ATROVENT) 0.06 % nasal spray, Place 2 sprays into both nostrils 4 (four) times daily., Disp: 15 mL, Rfl: 0   lidocaine (XYLOCAINE) 2 % solution, Use as directed 15 mLs in the mouth or throat as needed for mouth pain., Disp: 100 mL, Rfl: 0   metroNIDAZOLE (FLAGYL) 500 MG tablet, Take 1 tablet (500 mg total) by mouth 2 (two) times daily., Disp: 14 tablet, Rfl: 0   No Known Allergies  History reviewed. No pertinent past medical history.   History reviewed. No pertinent surgical history.  Family History  Problem Relation Age of Onset   Healthy Mother    Healthy Father     Social History   Tobacco Use   Smoking status: Every Day    Smokeless tobacco: Never  Substance Use Topics   Alcohol use: Yes   Drug use: Never    ROS   Objective:   Vitals: BP 102/65 (BP Location: Left Arm)    Pulse 94    Temp 98.7 F (37.1 C) (Oral)    Resp 18    LMP 04/21/2021 (Exact Date)    SpO2 100%   Physical Exam Exam conducted with a chaperone present (CMA Adrianna).  Constitutional:      General: She is not in acute distress.    Appearance: Normal appearance. She is well-developed. She is not ill-appearing, toxic-appearing or diaphoretic.  HENT:     Head: Normocephalic and atraumatic.     Nose: Nose normal.     Mouth/Throat:     Mouth: Mucous membranes are moist.     Pharynx: Oropharynx is clear.  Eyes:     General: No scleral icterus.       Right eye: No discharge.        Left eye: No discharge.     Extraocular Movements: Extraocular movements intact.     Conjunctiva/sclera: Conjunctivae normal.     Pupils: Pupils are equal, round, and reactive to light.  Cardiovascular:     Rate and Rhythm: Normal rate.  Pulmonary:     Effort: Pulmonary effort is normal.  Abdominal:     General: Bowel sounds are normal. There is no distension.  Palpations: Abdomen is soft. There is no mass.     Tenderness: There is no abdominal tenderness. There is no right CVA tenderness, left CVA tenderness, guarding or rebound.  Genitourinary:   Skin:    General: Skin is warm and dry.  Neurological:     General: No focal deficit present.     Mental Status: She is alert and oriented to person, place, and time.  Psychiatric:        Mood and Affect: Mood normal.        Behavior: Behavior normal.        Thought Content: Thought content normal.        Judgment: Judgment normal.    Results for orders placed or performed during the hospital encounter of 06/09/21 (from the past 24 hour(s))  POC Urinalysis dipstick     Status: Abnormal   Collection Time: 06/09/21  3:51 PM  Result Value Ref Range   Glucose, UA NEGATIVE NEGATIVE mg/dL    Bilirubin Urine SMALL (A) NEGATIVE   Ketones, ur 40 (A) NEGATIVE mg/dL   Specific Gravity, Urine >=1.030 1.005 - 1.030   Hgb urine dipstick TRACE (A) NEGATIVE   pH 5.5 5.0 - 8.0   Protein, ur 30 (A) NEGATIVE mg/dL   Urobilinogen, UA 4.0 (H) 0.0 - 1.0 mg/dL   Nitrite NEGATIVE NEGATIVE   Leukocytes,Ua SMALL (A) NEGATIVE  POC urine pregnancy     Status: Abnormal   Collection Time: 06/09/21  3:56 PM  Result Value Ref Range   Preg Test, Ur POSITIVE (A) NEGATIVE    Assessment and Plan :   PDMP not reviewed this encounter.  1. Cellulitis of right buttock   2. Positive pregnancy test   3. Bacterial vaginosis    Offered incision and drainage but patient adamantly refused.  Counseled that if she has no improvement with antibiotics and warm compresses she would have to return and we would need to do an incision and drainage at that point.  She is to start clindamycin which is safe in pregnancy.  Advised that she start taking metronidazole to treat her bacterial vaginosis as this poses a risk for pregnancy.  Keep follow-up appointment with her OB.  Start prenatal vitamin. Counseled patient on potential for adverse effects with medications prescribed/recommended today, ER and return-to-clinic precautions discussed, patient verbalized understanding.    Jaynee Eagles, PA-C 06/09/21 1709

## 2021-06-09 NOTE — Discharge Instructions (Signed)
Please make sure you use Tylenol for your buttock pain.  Start clindamycin to address the cellulitis.  Apply warm compresses to the area.  If you change your mind about having a procedure done would like incision and drainage or if you worsen then please come back to our clinic.  Otherwise follow-up with your OB/GYN.  Make sure you finish the prescription for metronidazole as it is important that you get treated for bacterial vaginosis in pregnancy.

## 2021-06-09 NOTE — ED Triage Notes (Signed)
Pt presents with c/o a knot on her bottom x 3 days.   States she is pregnant and has concerns.

## 2021-06-13 ENCOUNTER — Ambulatory Visit: Payer: Medicaid Other

## 2021-06-13 ENCOUNTER — Other Ambulatory Visit: Payer: Self-pay

## 2021-06-22 ENCOUNTER — Other Ambulatory Visit: Payer: Self-pay

## 2021-06-22 ENCOUNTER — Inpatient Hospital Stay (HOSPITAL_COMMUNITY): Payer: Medicaid Other

## 2021-06-22 ENCOUNTER — Ambulatory Visit (INDEPENDENT_AMBULATORY_CARE_PROVIDER_SITE_OTHER): Payer: Self-pay

## 2021-06-22 ENCOUNTER — Inpatient Hospital Stay (HOSPITAL_COMMUNITY)
Admission: AD | Admit: 2021-06-22 | Discharge: 2021-06-22 | Disposition: A | Discharge status: Home / Self Care | Payer: Medicaid Other | Attending: Obstetrics and Gynecology | Admitting: Obstetrics and Gynecology

## 2021-06-22 ENCOUNTER — Encounter (HOSPITAL_COMMUNITY): Payer: Self-pay | Admitting: Obstetrics and Gynecology

## 2021-06-22 DIAGNOSIS — O99611 Diseases of the digestive system complicating pregnancy, first trimester: Secondary | ICD-10-CM | POA: Insufficient documentation

## 2021-06-22 DIAGNOSIS — O26891 Other specified pregnancy related conditions, first trimester: Secondary | ICD-10-CM | POA: Diagnosis not present

## 2021-06-22 DIAGNOSIS — O26899 Other specified pregnancy related conditions, unspecified trimester: Secondary | ICD-10-CM | POA: Diagnosis not present

## 2021-06-22 DIAGNOSIS — Z3491 Encounter for supervision of normal pregnancy, unspecified, first trimester: Secondary | ICD-10-CM

## 2021-06-22 DIAGNOSIS — R109 Unspecified abdominal pain: Secondary | ICD-10-CM | POA: Insufficient documentation

## 2021-06-22 DIAGNOSIS — K5900 Constipation, unspecified: Secondary | ICD-10-CM | POA: Insufficient documentation

## 2021-06-22 DIAGNOSIS — Z3A08 8 weeks gestation of pregnancy: Secondary | ICD-10-CM | POA: Insufficient documentation

## 2021-06-22 DIAGNOSIS — Z3480 Encounter for supervision of other normal pregnancy, unspecified trimester: Secondary | ICD-10-CM | POA: Insufficient documentation

## 2021-06-22 DIAGNOSIS — O3680X1 Pregnancy with inconclusive fetal viability, fetus 1: Secondary | ICD-10-CM

## 2021-06-22 LAB — URINALYSIS, ROUTINE W REFLEX MICROSCOPIC
Bilirubin Urine: NEGATIVE
Glucose, UA: NEGATIVE mg/dL
Hgb urine dipstick: NEGATIVE
Ketones, ur: 20 mg/dL — AB
Nitrite: NEGATIVE
Protein, ur: 30 mg/dL — AB
Specific Gravity, Urine: 1.026 (ref 1.005–1.030)
pH: 5 (ref 5.0–8.0)

## 2021-06-22 LAB — WET PREP, GENITAL
Clue Cells Wet Prep HPF POC: NONE SEEN
Sperm: NONE SEEN
Trich, Wet Prep: NONE SEEN
WBC, Wet Prep HPF POC: >=10 — AB (ref <10)
Yeast Wet Prep HPF POC: NONE SEEN

## 2021-06-22 LAB — HCG, QUANTITATIVE, PREGNANCY: hCG, Beta Chain, Quant, S: 56169 m[IU]/mL — ABNORMAL HIGH (ref <5)

## 2021-06-22 LAB — ABO/RH: ABO/RH(D): O POS

## 2021-06-22 LAB — CBC
HCT: 35 % — ABNORMAL LOW (ref 36.0–46.0)
Hemoglobin: 11.3 g/dL — ABNORMAL LOW (ref 12.0–15.0)
MCH: 24.8 pg — ABNORMAL LOW (ref 26.0–34.0)
MCHC: 32.3 g/dL (ref 30.0–36.0)
MCV: 76.8 fL — ABNORMAL LOW (ref 80.0–100.0)
Platelets: 318 10*3/uL (ref 150–400)
RBC: 4.56 MIL/uL (ref 3.87–5.11)
RDW: 17.9 % — ABNORMAL HIGH (ref 11.5–15.5)
WBC: 9.2 10*3/uL (ref 4.0–10.5)
nRBC: 0 % (ref 0.0–0.2)

## 2021-06-22 MED ORDER — DOCUSATE SODIUM 250 MG PO CAPS: 250.0000 mg | ORAL_CAPSULE | Freq: Two times a day (BID) | ORAL | 0 refills | Status: DC

## 2021-06-22 MED ORDER — TERCONAZOLE 0.4 % VA CREA: 1.0000 | TOPICAL_CREAM | Freq: Every day | VAGINAL | 0 refills | Status: DC

## 2021-06-22 NOTE — Progress Notes (Signed)
Pt states having a lot of soreness & pain in upper abdomen around rib area & lower abdomen, has been having pain for a while. Has taken Tylenol, states helps some. Spoke with Dr. Donavan Foil, went over symptoms, he suggested Pt to go to MAU to make sure that she is not having an Ectopic Pregnancy. Advised Pt. She verbalized understanding.

## 2021-06-22 NOTE — MAU Note (Signed)
Pt reports pain in her upper abd/ribs that is sharp also c/o cramping in lower stomach as well. Pain since Monday.  Denies any vag bleeding or discharge.

## 2021-06-22 NOTE — Discharge Instructions (Signed)

## 2021-06-22 NOTE — MAU Provider Note (Signed)
History     CSN: [id number]  Arrival date and time: 06/22/21 1617   Event Date/Time   First Provider Initiated Contact with Patient 06/22/21 1705      Chief Complaint  Patient presents with   Abdominal Pain   HPI Mindy Key is a 21 y.o. G3P1011 at [redacted]w[redacted]d who presents with abdominal pain. She reports the pain is all over her abdomen. She rates the pain a 6/10 and has not tried anything for the pain. She reports some "clumpy" white discharge that itches. She denies any bleeding.   OB History     Gravida  3   Para  1   Term  1   Preterm      AB  1   Living  1      SAB  1   IAB      Ectopic      Multiple      Live Births  1           History reviewed. No pertinent past medical history.  Past Surgical History:  Procedure Laterality Date   CESAREAN SECTION      Family History  Problem Relation Age of Onset   Healthy Mother    Healthy Father    Cancer Maternal Uncle     Social History   Tobacco Use   Smoking status: Former    Types: Cigarettes    Quit date: 04/05/2021    Years since quitting: 0.2   Smokeless tobacco: Never  Substance Use Topics   Alcohol use: Not Currently   Drug use: Never    Allergies: No Known Allergies  Medications Prior to Admission  Medication Sig Dispense Refill Last Dose   benzonatate (TESSALON) 200 MG capsule Take 1 capsule (200 mg total) by mouth every 8 (eight) hours. 21 capsule 0    clindamycin (CLEOCIN) 300 MG capsule Take 1 capsule (300 mg total) by mouth 3 (three) times daily. (Patient not taking: Reported on 06/22/2021) 30 capsule 0    ibuprofen (ADVIL) 800 MG tablet Take 1 tablet (800 mg total) by mouth 3 (three) times daily. (Patient not taking: Reported on 06/22/2021) 21 tablet 0    ipratropium (ATROVENT) 0.06 % nasal spray Place 2 sprays into both nostrils 4 (four) times daily. (Patient not taking: Reported on 06/22/2021) 15 mL 0    lidocaine (XYLOCAINE) 2 % solution Use as directed 15 mLs in the mouth or  throat as needed for mouth pain. (Patient not taking: Reported on 06/22/2021) 100 mL 0    metroNIDAZOLE (FLAGYL) 500 MG tablet Take 1 tablet (500 mg total) by mouth 2 (two) times daily. 14 tablet 0     Review of Systems  Constitutional: Negative.  Negative for fatigue and fever.  HENT: Negative.    Respiratory: Negative.  Negative for shortness of breath.   Cardiovascular: Negative.  Negative for chest pain.  Gastrointestinal:  Positive for abdominal pain. Negative for constipation, diarrhea, nausea and vomiting.  Genitourinary: Negative.  Negative for dysuria, vaginal bleeding and vaginal discharge.  Neurological: Negative.  Negative for dizziness and headaches.  Physical Exam   Blood pressure 125/79, pulse 95, temperature 99.4 F (37.4 C), resp. rate 18, height 5\' 3"  (1.6 m), weight 78.9 kg, last menstrual period 04/24/2021.  Physical Exam Vitals and nursing note reviewed.  Constitutional:      General: She is not in acute distress.    Appearance: She is well-developed.  HENT:     Head: Normocephalic.  Eyes:  Pupils: Pupils are equal, round, and reactive to light.  Cardiovascular:     Rate and Rhythm: Normal rate and regular rhythm.     Heart sounds: Normal heart sounds.  Pulmonary:     Effort: Pulmonary effort is normal. No respiratory distress.     Breath sounds: Normal breath sounds.  Abdominal:     General: Bowel sounds are normal. There is no distension.     Palpations: Abdomen is soft.     Tenderness: There is no abdominal tenderness.  Skin:    General: Skin is warm and dry.  Neurological:     Mental Status: She is alert and oriented to person, place, and time.  Psychiatric:        Mood and Affect: Mood normal.        Behavior: Behavior normal.        Thought Content: Thought content normal.        Judgment: Judgment normal.    MAU Course  Procedures Results for orders placed or performed during the hospital encounter of 06/22/21 (from the past 24 hour(s))   CBC     Status: Abnormal   Collection Time: 06/22/21  4:17 PM  Result Value Ref Range   WBC 9.2 4.0 - 10.5 K/uL   RBC 4.56 3.87 - 5.11 MIL/uL   Hemoglobin 11.3 (L) 12.0 - 15.0 g/dL   HCT 14.735.0 (L) 82.936.0 - 56.246.0 %   MCV 76.8 (L) 80.0 - 100.0 fL   MCH 24.8 (L) 26.0 - 34.0 pg   MCHC 32.3 30.0 - 36.0 g/dL   RDW 13.017.9 (H) 86.511.5 - 78.415.5 %   Platelets 318 150 - 400 K/uL   nRBC 0.0 0.0 - 0.2 %  ABO/Rh     Status: None   Collection Time: 06/22/21  4:45 PM  Result Value Ref Range   ABO/RH(D) O POS    No rh immune globuloin      NOT A RH IMMUNE GLOBULIN CANDIDATE, PT RH POSITIVE Performed at [facility], [address] [place], [place] [address]   hCG, quantitative, pregnancy     Status: Abnormal   Collection Time: 06/22/21  4:52 PM  Result Value Ref Range   hCG, Beta Chain, Quant, S 56,169 (H) <5 mIU/mL  Urinalysis, Routine w reflex microscopic Urine, Clean Catch     Status: Abnormal   Collection Time: 06/22/21  5:01 PM  Result Value Ref Range   Color, Urine AMBER (A) YELLOW   APPearance HAZY (A) CLEAR   Specific Gravity, Urine 1.026 1.005 - 1.030   pH 5.0 5.0 - 8.0   Glucose, UA NEGATIVE NEGATIVE mg/dL   Hgb urine dipstick NEGATIVE NEGATIVE   Bilirubin Urine NEGATIVE NEGATIVE   Ketones, ur 20 (A) NEGATIVE mg/dL   Protein, ur 30 (A) NEGATIVE mg/dL   Nitrite NEGATIVE NEGATIVE   Leukocytes,Ua LARGE (A) NEGATIVE   RBC / HPF 6-10 0 - 5 RBC/hpf   WBC, UA 11-20 0 - 5 WBC/hpf   Bacteria, UA FEW (A) NONE SEEN   Squamous Epithelial / LPF 0-5 0 - 5   Mucus PRESENT   Wet prep, genital     Status: Abnormal   Collection Time: 06/22/21  5:19 PM  Result Value Ref Range   Yeast Wet Prep HPF POC NONE SEEN NONE SEEN   Trich, Wet Prep NONE SEEN NONE SEEN   Clue Cells Wet Prep HPF POC NONE SEEN NONE SEEN   WBC, Wet Prep HPF POC >=10 (A) <10  Sperm NONE SEEN     US OB Comp Less 14 Wks  Result Date: 06/22/2021 CLINICAL DATA:  Epigastric pain, pelvic pain EXAM: OBSTETRIC <14 WK ULTRASOUND  TECHNIQUE: Transabdominal ultrasound was performed for evaluation of the gestation as well as the maternal uterus and adnexal regions. COMPARISON:  None. FINDINGS: Intrauterine gestational sac: Single Yolk sac:  Visualized. Embryo:  Visualized. Cardiac Activity: Visualized. Heart Rate: 169 bpm MSD:    mm    w     d CRL:   18.7 mm   8 w 2 d                  [place] EDC: 01/30/2022 Subchorionic hemorrhage:  None visualized. Maternal uterus/adnexae: No adnexal mass or free fluid. IMPRESSION: Eight week 2 day intrauterine pregnancy. Fetal heart rate 169 beats per minute. No acute maternal findings. Electronically Signed   By: Charlett Nose M.D.   On: 06/22/2021 18:29     MDM UA, UPT CBC, HCG ABO/Rh- O Pos Wet prep and gc/chlamydia US OB Comp Less 14 weeks with Transvaginal   Assessment and Plan   1. Abdominal pain affecting pregnancy   2. Normal intrauterine pregnancy on prenatal ultrasound in first trimester   3. [redacted] weeks gestation of pregnancy   4. Constipation during pregnancy in first trimester    -Discharge home in stable condition -Rx for terazol and colace sent to patient's pharmacy -First trimester precautions discussed -Patient advised to follow-up with OB as scheduled for prenatal care -Patient may return to MAU as needed or if her condition were to change or worsen   Rolm Bookbinder CNM 06/22/2021, 5:05 PM

## 2021-06-22 NOTE — Patient Instructions (Signed)
  At our [facility], we work as an integrated team, providing care to address both physical and emotional health. Your medical provider may refer you to see our Behavioral Health Clinician (BHC) on the same day you see your medical provider, as availability permits; often scheduled virtually at your convenience.  Our BHC is available to all patients, visits generally last between 20-30 minutes, but can be longer or shorter, depending on patient need. The BHC offers help with stress management, coping with symptoms of depression and anxiety, major life changes , sleep issues, changing risky behavior, grief and loss, life stress, working on personal life goals, and  behavioral health issues, as these all affect your overall health and wellness.  The BHC is NOT available for the following: FMLA paperwork, court-ordered evaluations, specialty assessments (custody or disability), letters to employers, or obtaining certification for an emotional support animal. The BHC does not provide long-term therapy. You have the right to refuse integrated behavioral health services, or to reschedule to see the BHC at a later date.  Confidentiality exception: If it is suspected that a child or disabled adult is being abused or neglected, we are required by law to report that to either Child Protective Services or Adult Protective Services.  If you have a diagnosis of Bipolar affective disorder, Schizophrenia, or recurrent Major depressive disorder, we will recommend that you establish care with a psychiatrist, as these are lifelong, chronic conditions, and we want your overall emotional health and medications to be more closely monitored. If you anticipate needing extended maternity leave due to mental health issues postpartum, it it recommended you inform your medical provider, so we can put in a referral to a psychiatrist as soon as possible. The BHC is unable to recommend an extended maternity leave for mental  health issues. Your medical provider or BHC may refer you to a therapist for ongoing, traditional therapy, or to a psychiatrist, for medication management, if it would benefit your overall health. Depending on your insurance, you may have a copay or be charged a deductible, depending on your insurance, to see the BHC. If you are uninsured, it is recommended that you apply for financial assistance. (Forms may be requested at the front desk for in-person visits, via MyChart, or request a form during a virtual visit).  If you see the BHC more than 6 times, you will have to complete a comprehensive clinical assessment interview with the BHC to resume integrated services.  For virtual visits with the BHC, you must be physically in the state of [place] at the time of the visit. For example, if you live in [place], you will have to do an in-person visit with the BHC, and your out-of-state insurance may not cover behavioral health services in [place]. If you are going out of the state or country for any reason, the BHC may see you virtually when you return to [place], but not while you are physically outside of [place].    

## 2021-06-22 NOTE — Progress Notes (Signed)
New OB Intake  I connected with  Mindy Key on 06/22/21 at  3:15 PM EST by In Person Visit and verified that I am speaking with the correct person using two identifiers. Nurse is located at [facility] and pt is located at [name].  I discussed the limitations, risks, security and privacy concerns of performing an evaluation and management service by telephone and the availability of in person appointments. I also discussed with the patient that there may be a patient responsible charge related to this service. The patient expressed understanding and agreed to proceed.  I explained I am completing New OB Intake today. We discussed her EDD of 01/29/22 that is based on LMP of 04/24/21. Pt is G3/P1. I reviewed her allergies, medications, Medical/Surgical/OB history, and appropriate screenings. I informed her of [facility] services. Based on history, this is a/an  pregnancy uncomplicated .   There are no problems to display for this patient.   Concerns addressed today  Delivery Plans:  Plans to deliver at [facility].   MyChart/Babyscripts MyChart access verified. I explained pt will have some visits in office and some virtually. Babyscripts instructions given and order placed. Patient verifies receipt of registration text/e-mail. Account successfully created and app downloaded.  Blood Pressure Cuff  Blood pressure cuff ordered for patient to pick-up from [employer]. Explained after first prenatal appt pt will check weekly and document in 69.  Weight scale: Patient does / does not  have weight scale. Weight scale ordered for patient to pick up from [employer].   Anatomy US Explained first scheduled [place] will be around 19 weeks. Anatomy US scheduled for 0 at 0. Pt notified to arrive at 0.  Labs Discussed Johnsie Cancel genetic screening with patient. Would like both Panorama and Horizon drawn at new OB visit. Routine prenatal labs needed.  Covid Vaccine Patient has not covid vaccine.    CenteringPregnancy Candidate?  If yes, offer as possibility  Mother/ Baby Dyad Candidate?    If yes, offer as possibility  Informed patient of [facility] website  and placed link in her AVS.   Social Determinants of Health Food Insecurity: Patient denies food insecurity. WIC Referral: Patient is interested in referral to [facility] [address]  Transportation: Patient denies transportation needs. Childcare: Discussed no children allowed at ultrasound appointments. Offered childcare services; patient declines childcare services at this time.  Send link to Pregnancy Navigators   Placed OB Box on problem list and updated  First visit review I reviewed new OB appt with pt. I explained she will have a pelvic exam, ob bloodwork with genetic screening, and PAP smear. Explained pt will be seen by Dr. Rip Harbour at first visit; encounter routed to appropriate provider. Explained that patient will be seen by pregnancy navigator following visit with provider. [facility] information placed in AVS.   Mindy Key, [place] 06/22/2021  3:23 PM

## 2021-06-23 ENCOUNTER — Telehealth: Payer: Self-pay

## 2021-06-23 LAB — CULTURE, OB URINE: Culture: 10000 — AB

## 2021-06-23 LAB — GC/CHLAMYDIA PROBE AMP (~~LOC~~) NOT AT ARMC
Chlamydia: NEGATIVE
Comment: NEGATIVE
Comment: NORMAL
Neisseria Gonorrhea: NEGATIVE

## 2021-06-23 MED ORDER — CEPHALEXIN 500 MG PO CAPS: 500.0000 mg | ORAL_CAPSULE | Freq: Three times a day (TID) | ORAL | 0 refills | Status: DC

## 2021-06-23 NOTE — Telephone Encounter (Signed)
Called patient regarding positive urine culture and recommended treatment now as well as during labor. Patient verbalized understanding and plan of care.  Rolm Bookbinder, CNM 06/23/21 4:08 PM

## 2021-07-06 ENCOUNTER — Ambulatory Visit (INDEPENDENT_AMBULATORY_CARE_PROVIDER_SITE_OTHER): Payer: Medicaid Other | Admitting: Obstetrics and Gynecology

## 2021-07-06 ENCOUNTER — Other Ambulatory Visit (HOSPITAL_COMMUNITY)
Admission: RE | Admit: 2021-07-06 | Discharge: 2021-07-06 | Disposition: A | Discharge status: Home / Self Care | Payer: Medicaid Other | Source: Ambulatory Visit | Attending: Obstetrics and Gynecology | Admitting: Obstetrics and Gynecology

## 2021-07-06 ENCOUNTER — Encounter: Payer: Self-pay | Admitting: Obstetrics and Gynecology

## 2021-07-06 ENCOUNTER — Other Ambulatory Visit: Payer: Self-pay

## 2021-07-06 VITALS — BP 122/82 | HR 85 | Wt 173.6 lb

## 2021-07-06 DIAGNOSIS — Z23 Encounter for immunization: Secondary | ICD-10-CM | POA: Diagnosis not present

## 2021-07-06 DIAGNOSIS — R8271 Bacteriuria: Secondary | ICD-10-CM | POA: Diagnosis not present

## 2021-07-06 DIAGNOSIS — Z98891 History of uterine scar from previous surgery: Secondary | ICD-10-CM

## 2021-07-06 DIAGNOSIS — Z3480 Encounter for supervision of other normal pregnancy, unspecified trimester: Secondary | ICD-10-CM

## 2021-07-06 DIAGNOSIS — Z3400 Encounter for supervision of normal first pregnancy, unspecified trimester: Secondary | ICD-10-CM | POA: Diagnosis not present

## 2021-07-06 LAB — OB RESULTS CONSOLE GBS: GBS: POSITIVE

## 2021-07-06 NOTE — Patient Instructions (Signed)
First Trimester of Pregnancy °The first trimester of pregnancy starts on the first day of your last menstrual period until the end of week 12. This is months 1 through 3 of pregnancy. A week after a sperm fertilizes an egg, the egg will implant into the wall of the uterus and begin to develop into a baby. By the end of 12 weeks, all the baby's organs will be formed and the baby will be 2-3 inches in size. °Body changes during your first trimester °Your body goes through many changes during pregnancy. The changes vary and generally return to normal after your baby is born. °Physical changes °You may gain or lose weight. °Your breasts may begin to grow larger and become tender. The tissue that surrounds your nipples (areola) may become darker. °Dark spots or blotches (chloasma or mask of pregnancy) may develop on your face. °You may have changes in your hair. These can include thickening or thinning of your hair or changes in texture. °Health changes °You may feel nauseous, and you may vomit. °You may have heartburn. °You may develop headaches. °You may develop constipation. °Your gums may bleed and may be sensitive to brushing and flossing. °Other changes °You may tire easily. °You may urinate more often. °Your menstrual periods will stop. °You may have a loss of appetite. °You may develop cravings for certain kinds of food. °You may have changes in your emotions from day to day. °You may have more vivid and strange dreams. °Follow these instructions at home: °Medicines °Follow your health care provider's instructions regarding medicine use. Specific medicines may be either safe or unsafe to take during pregnancy. Do not take any medicines unless told to by your health care provider. °Take a prenatal vitamin that contains at least 600 micrograms (mcg) of folic acid. °Eating and drinking °Eat a healthy diet that includes fresh fruits and vegetables, whole grains, good sources of protein such as meat, eggs, or tofu,  and low-fat dairy products. °Avoid raw meat and unpasteurized juice, milk, and cheese. These carry germs that can harm you and your baby. °If you feel nauseous or you vomit: °Eat 4 or 5 small meals a day instead of 3 large meals. °Try eating a few soda crackers. °Drink liquids between meals instead of during meals. °You may need to take these actions to prevent or treat constipation: °Drink enough fluid to keep your urine pale yellow. °Eat foods that are high in fiber, such as beans, whole grains, and fresh fruits and vegetables. °Limit foods that are high in fat and processed sugars, such as fried or sweet foods. °Activity °Exercise only as directed by your health care provider. Most people can continue their usual exercise routine during pregnancy. Try to exercise for 30 minutes at least 5 days a week. °Stop exercising if you develop pain or cramping in the lower abdomen or lower back. °Avoid exercising if it is very hot or humid or if you are at high altitude. °Avoid heavy lifting. °If you choose to, you may have sex unless your health care provider tells you not to. °Relieving pain and discomfort °Wear a good support bra to relieve breast tenderness. °Rest with your legs elevated if you have leg cramps or low back pain. °If you develop bulging veins (varicose veins) in your legs: °Wear support hose as told by your health care provider. °Elevate your feet for 15 minutes, 3-4 times a day. °Limit salt in your diet. °Safety °Wear your seat belt at all times when driving   or riding in a car. °Talk with your health care provider if someone is verbally or physically abusive to you. °Talk with your health care provider if you are feeling sad or have thoughts of hurting yourself. °Lifestyle °Do not use hot tubs, steam rooms, or saunas. °Do not douche. Do not use tampons or scented sanitary pads. °Do not use herbal remedies, alcohol, illegal drugs, or medicines that are not approved by your health care provider. Chemicals  in these products can harm your baby. °Do not use any products that contain nicotine or tobacco, such as cigarettes, e-cigarettes, and chewing tobacco. If you need help quitting, ask your health care provider. °Avoid cat litter boxes and soil used by cats. These carry germs that can cause birth defects in the baby and possibly loss of the unborn baby (fetus) by miscarriage or stillbirth. °General instructions °During routine prenatal visits in the first trimester, your health care provider will do a physical exam, perform necessary tests, and ask you how things are going. Keep all follow-up visits. This is important. °Ask for help if you have counseling or nutritional needs during pregnancy. Your health care provider can offer advice or refer you to specialists for help with various needs. °Schedule a dentist appointment. At home, brush your teeth with a soft toothbrush. Floss gently. °Write down your questions. Take them to your prenatal visits. °Where to find more information °American Pregnancy Association: americanpregnancy.org °American College of Obstetricians and Gynecologists: acog.org/en/Womens%20Health/Pregnancy °Office on Women's Health: womenshealth.gov/pregnancy °Contact a health care provider if you have: °Dizziness. °A fever. °Mild pelvic cramps, pelvic pressure, or nagging pain in the abdominal area. °Nausea, vomiting, or diarrhea that lasts for 24 hours or longer. °A bad-smelling vaginal discharge. °Pain when you urinate. °Known exposure to a contagious illness, such as chickenpox, measles, Zika virus, HIV, or hepatitis. °Get help right away if you have: °Spotting or bleeding from your vagina. °Severe abdominal cramping or pain. °Shortness of breath or chest pain. °Any kind of trauma, such as from a fall or a car crash. °New or increased pain, swelling, or redness in an arm or leg. °Summary °The first trimester of pregnancy starts on the first day of your last menstrual period until the end of week  12 (months 1 through 3). °Eating 4 or 5 small meals a day rather than 3 large meals may help to relieve nausea and vomiting. °Do not use any products that contain nicotine or tobacco, such as cigarettes, e-cigarettes, and chewing tobacco. If you need help quitting, ask your health care provider. °Keep all follow-up visits. This is important. °This information is not intended to replace advice given to you by your health care provider. Make sure you discuss any questions you have with your health care provider. °Document Revised: 10/21/2019 Document Reviewed: 08/27/2019 °Elsevier Patient Education © 2022 Elsevier Inc. ° °

## 2021-07-06 NOTE — Progress Notes (Signed)
Subjective:  Mindy Key is a 21 y.o. G3P1011 at [redacted]w[redacted]d being seen today for her first OB visit. EDD by first trimester U/S. H/O LTCS due to malpresentation. Denies any chronic medical problems or medications.  She is currently monitored for the following issues for this low-risk pregnancy and has Supervision of other normal pregnancy, antepartum; History of cesarean section; and GBS bacteriuria on their problem list.  Patient reports no complaints.  Contractions: Not present. Vag. Bleeding: None.  Movement: Present. Denies leaking of fluid.   The following portions of the patient's history were reviewed and updated as appropriate: allergies, current medications, past family history, past medical history, past social history, past surgical history and problem list. Problem list updated.  Objective:   Vitals:   07/06/21 0904  BP: 122/82  Pulse: 85  Weight: 173 lb 9.6 oz (78.7 kg)    Fetal Status:     Movement: Present     General:  Alert, oriented and cooperative. Patient is in no acute distress.  Skin: Skin is warm and dry. No rash noted.   Cardiovascular: Normal heart rate noted  Respiratory: Normal respiratory effort, no problems with respiration noted  Abdomen: Soft, gravid, appropriate for gestational age. Pain/Pressure: Absent     Pelvic:  Cervical exam performed        Extremities: Normal range of motion.  Edema: None  Mental Status: Normal mood and affect. Normal behavior. Normal judgment and thought content.   Urinalysis:      Assessment and Plan:  Pregnancy: G3P1011 at [redacted]w[redacted]d  1. Supervision of other normal pregnancy, antepartum Prenatal labs and care reviewed with pt Genetic testing discussed - Cytology - PAP( [place]) - Genetic Screening - CBC/D/Plt+RPR+Rh+ABO+RubIgG... - Hemoglobin A1c - Flu Vaccine QUAD 83mo+IM (Fluarix, Fluzone & Alfiuria Quad PF)  2. History of cesarean section Discuss at later visits  3. GBS bacteriuria Complete current Tx and will need  Tx while in labor  Preterm labor symptoms and general obstetric precautions including but not limited to vaginal bleeding, contractions, leaking of fluid and fetal movement were reviewed in detail with the patient. Please refer to After Visit Summary for other counseling recommendations.  Return in about 4 weeks (around 08/03/2021) for OB visit, face to face, any provider.   Chancy Milroy, MD

## 2021-07-10 LAB — CBC/D/PLT+RPR+RH+ABO+RUBIGG...
Basophils Absolute: 0 10*3/uL (ref 0.0–0.2)
Basos: 0 %
EOS (ABSOLUTE): 0.1 10*3/uL (ref 0.0–0.4)
Eos: 2 %
HCV Ab: 0.1 s/co ratio (ref 0.0–0.9)
HIV Screen 4th Generation wRfx: NONREACTIVE
Hematocrit: 33.9 % — ABNORMAL LOW (ref 34.0–46.6)
Hemoglobin: 10.7 g/dL — ABNORMAL LOW (ref 11.1–15.9)
Hepatitis B Surface Ag: NEGATIVE
Immature Grans (Abs): 0 10*3/uL (ref 0.0–0.1)
Immature Granulocytes: 0 %
Lymphocytes Absolute: 2.8 10*3/uL (ref 0.7–3.1)
Lymphs: 34 %
MCH: 24.6 pg — ABNORMAL LOW (ref 26.6–33.0)
MCHC: 31.6 g/dL (ref 31.5–35.7)
MCV: 78 fL — ABNORMAL LOW (ref 79–97)
Monocytes Absolute: 0.6 10*3/uL (ref 0.1–0.9)
Monocytes: 7 %
Neutrophils Absolute: 4.8 10*3/uL (ref 1.4–7.0)
Neutrophils: 57 %
Platelets: 280 10*3/uL (ref 150–450)
RBC: 4.35 x10E6/uL (ref 3.77–5.28)
RDW: 17.8 % — ABNORMAL HIGH (ref 11.7–15.4)
RPR Ser Ql: NONREACTIVE
Rh Factor: POSITIVE
Rubella Antibodies, IGG: 1.64 index (ref >0.99)
WBC: 8.5 10*3/uL (ref 3.4–10.8)

## 2021-07-10 LAB — HCV INTERPRETATION

## 2021-07-10 LAB — AB SCR+ANTIBODY ID: Antibody Screen: POSITIVE — AB

## 2021-07-10 LAB — HEMOGLOBIN A1C
Est. average glucose Bld gHb Est-mCnc: 111 mg/dL
Hgb A1c MFr Bld: 5.5 % (ref 4.8–5.6)

## 2021-07-12 ENCOUNTER — Telehealth: Payer: Self-pay

## 2021-07-12 ENCOUNTER — Encounter: Payer: Self-pay | Admitting: Obstetrics and Gynecology

## 2021-07-12 DIAGNOSIS — R87610 Atypical squamous cells of undetermined significance on cytologic smear of cervix (ASC-US): Secondary | ICD-10-CM | POA: Insufficient documentation

## 2021-07-12 LAB — CYTOLOGY - PAP
Chlamydia: NEGATIVE
Comment: NEGATIVE
Comment: NEGATIVE
Comment: NEGATIVE
Comment: NORMAL
Diagnosis: UNDETERMINED — AB
High risk HPV: POSITIVE — AB
Neisseria Gonorrhea: NEGATIVE
Trichomonas: NEGATIVE

## 2021-07-12 MED ORDER — FERROUS SULFATE 325 (65 FE) MG PO TBEC: 325.0000 mg | DELAYED_RELEASE_TABLET | ORAL | 3 refills | Status: DC

## 2021-07-12 NOTE — Telephone Encounter (Addendum)
-----   Message from Hermina Staggers, MD sent at 07/12/2021 10:07 AM EST ----- Please let pt know that her prenatal labs are normal for pregnancy. She does have anemia Please send in Rx for iron every other day. Thanks Michael   Left message for pt that I am calling with results and that there is an Rx for iron tablet at [facility] on [address]  If she could please call the office with results.    Addison Naegeli, RN  07/12/21

## 2021-07-12 NOTE — Addendum Note (Signed)
Addended by: Faythe Casa on: 07/12/2021 11:26 AM   Modules accepted: Orders

## 2021-07-12 NOTE — Telephone Encounter (Signed)
Pt returned call and I informed her that she is anemic and that an iron tablet has been sent to [facility] pharmacy.  Pt verbalized understanding.   Leonette Nutting  07/12/21

## 2021-07-19 ENCOUNTER — Encounter: Payer: Self-pay | Admitting: Obstetrics and Gynecology

## 2021-07-24 ENCOUNTER — Encounter: Payer: Self-pay | Admitting: Family Medicine

## 2021-08-03 ENCOUNTER — Ambulatory Visit (INDEPENDENT_AMBULATORY_CARE_PROVIDER_SITE_OTHER): Payer: Medicaid Other | Admitting: Family Medicine

## 2021-08-03 ENCOUNTER — Other Ambulatory Visit (HOSPITAL_COMMUNITY)
Admission: RE | Admit: 2021-08-03 | Discharge: 2021-08-03 | Disposition: A | Discharge status: Home / Self Care | Payer: Medicaid Other | Source: Ambulatory Visit | Attending: Family Medicine | Admitting: Family Medicine

## 2021-08-03 ENCOUNTER — Other Ambulatory Visit: Payer: Self-pay

## 2021-08-03 VITALS — BP 109/66 | HR 85 | Wt 170.6 lb

## 2021-08-03 DIAGNOSIS — O26891 Other specified pregnancy related conditions, first trimester: Secondary | ICD-10-CM | POA: Insufficient documentation

## 2021-08-03 DIAGNOSIS — R8271 Bacteriuria: Secondary | ICD-10-CM

## 2021-08-03 DIAGNOSIS — N898 Other specified noninflammatory disorders of vagina: Secondary | ICD-10-CM

## 2021-08-03 DIAGNOSIS — R87610 Atypical squamous cells of undetermined significance on cytologic smear of cervix (ASC-US): Secondary | ICD-10-CM

## 2021-08-03 DIAGNOSIS — R87810 Cervical high risk human papillomavirus (HPV) DNA test positive: Secondary | ICD-10-CM

## 2021-08-03 DIAGNOSIS — Z3480 Encounter for supervision of other normal pregnancy, unspecified trimester: Secondary | ICD-10-CM

## 2021-08-03 DIAGNOSIS — J069 Acute upper respiratory infection, unspecified: Secondary | ICD-10-CM

## 2021-08-03 DIAGNOSIS — Z98891 History of uterine scar from previous surgery: Secondary | ICD-10-CM

## 2021-08-03 NOTE — Progress Notes (Signed)
Patient in for routine prenatal, reports for several weeks she has been having a green/yellow discharge. Will complete self swab today. ? ?Altha Harm, CMA ? ?

## 2021-08-03 NOTE — Patient Instructions (Signed)

## 2021-08-03 NOTE — Progress Notes (Signed)
Green and yellow discharge present ? ? ?

## 2021-08-04 NOTE — Progress Notes (Signed)
? ?  PRENATAL VISIT NOTE ? ?Subjective:  ?Mindy Key is a 21 y.o. G3P1011 at [redacted]w[redacted]d being seen today for ongoing prenatal care.  She is currently monitored for the following issues for this low-risk pregnancy and has Supervision of other normal pregnancy, antepartum; History of cesarean section; GBS bacteriuria; and ASCUS with positive high risk HPV cervical on their problem list. ? ?Patient reports no complaints.  Contractions: Not present. Vag. Bleeding: Small.  Movement: Present. Denies leaking of fluid.  ? ?The following portions of the patient's history were reviewed and updated as appropriate: allergies, current medications, past family history, past medical history, past social history, past surgical history and problem list.  ? ?Objective:  ? ?Vitals:  ? 08/03/21 1622  ?BP: 109/66  ?Pulse: 85  ?Weight: 170 lb 9.6 oz (77.4 kg)  ? ? ?Fetal Status: Fetal Heart Rate (bpm): 164   Movement: Present    ? ?General:  Alert, oriented and cooperative. Patient is in no acute distress.  ?HEENT: TMs clear bilaterally, no exudate on pharyngeal exam   ?Skin: Skin is warm and dry. No rash noted.   ?Cardiovascular: Normal heart rate noted  ?Respiratory: Normal respiratory effort, no problems with respiration noted  ?Abdomen: Soft, gravid, appropriate for gestational age.  Pain/Pressure: Absent     ?Pelvic: Cervical exam deferred        ?Extremities: Normal range of motion.  Edema: None  ?Mental Status: Normal mood and affect. Normal behavior. Normal judgment and thought content.  ? ?Assessment and Plan:  ?Pregnancy: G3P1011 at [redacted]w[redacted]d ?1. History of cesarean section ?Hs not decided about plans for delivery ? ?2. GBS bacteriuria ?Would need Abx in labor if desires labor ? ?3. ASCUS with positive high risk HPV cervical ?Repeat pap pp due to age ? ?4. Supervision of other normal pregnancy, antepartum ?Needs anatomy u/s scheduled ?- [place] MFM OB DETAIL +14 WK; Future ? ?5. Vaginal discharge during pregnancy in first trimester ?Check  cultures ?- Cervicovaginal ancillary only( [place]) ? ?6. Viral upper respiratory tract infection ?Mucinex, anti-histamines, steroid nasal spray ? ?Preterm labor symptoms and general obstetric precautions including but not limited to vaginal bleeding, contractions, leaking of fluid and fetal movement were reviewed in detail with the patient. ?Please refer to After Visit Summary for other counseling recommendations.  ? ?Return in 4 weeks (on 08/31/2021). ? ?Future Appointments  ?Date Time Provider Department Center  ?08/31/2021  3:15 PM Anyanwu, Jethro Bastos, MD [facility]  ?09/05/2021  1:15 PM [facility] NURSE [facility]  ?09/05/2021  1:30 PM [facility] US3 [facility]  ? ? ?Reva Bores, MD ? ? ?

## 2021-08-07 LAB — CERVICOVAGINAL ANCILLARY ONLY
Bacterial Vaginitis (gardnerella): POSITIVE — AB
Candida Glabrata: NEGATIVE
Candida Vaginitis: POSITIVE — AB
Chlamydia: NEGATIVE
Comment: NEGATIVE
Comment: NEGATIVE
Comment: NEGATIVE
Comment: NEGATIVE
Comment: NEGATIVE
Comment: NORMAL
Neisseria Gonorrhea: NEGATIVE
Trichomonas: NEGATIVE

## 2021-08-07 MED ORDER — TERCONAZOLE 0.8 % VA CREA: 1.0000 | TOPICAL_CREAM | Freq: Every day | VAGINAL | 0 refills | Status: DC

## 2021-08-07 MED ORDER — METRONIDAZOLE 500 MG PO TABS: 500.0000 mg | ORAL_TABLET | Freq: Two times a day (BID) | ORAL | 0 refills | Status: DC

## 2021-08-07 NOTE — Addendum Note (Signed)
Addended by: Donnamae Jude on: 08/07/2021 01:21 PM ? ? Modules accepted: Orders ? ?

## 2021-08-09 ENCOUNTER — Telehealth: Payer: Self-pay | Admitting: Advanced Practice Midwife

## 2021-08-09 NOTE — Telephone Encounter (Signed)
Called to ask if pt is interested in Cent[facility]Pregnancy Group 4. Left VM and MyChart Message.  ?

## 2021-08-28 ENCOUNTER — Encounter (HOSPITAL_COMMUNITY): Payer: Self-pay | Admitting: Obstetrics and Gynecology

## 2021-08-28 ENCOUNTER — Inpatient Hospital Stay (HOSPITAL_COMMUNITY)
Admission: AD | Admit: 2021-08-28 | Discharge: 2021-08-28 | Disposition: A | Discharge status: Home / Self Care | Payer: Medicaid Other | Attending: Obstetrics and Gynecology | Admitting: Obstetrics and Gynecology

## 2021-08-28 ENCOUNTER — Other Ambulatory Visit: Payer: Self-pay

## 2021-08-28 DIAGNOSIS — Z87891 Personal history of nicotine dependence: Secondary | ICD-10-CM | POA: Insufficient documentation

## 2021-08-28 DIAGNOSIS — O26899 Other specified pregnancy related conditions, unspecified trimester: Secondary | ICD-10-CM | POA: Diagnosis not present

## 2021-08-28 DIAGNOSIS — O99612 Diseases of the digestive system complicating pregnancy, second trimester: Secondary | ICD-10-CM | POA: Insufficient documentation

## 2021-08-28 DIAGNOSIS — K5900 Constipation, unspecified: Secondary | ICD-10-CM | POA: Insufficient documentation

## 2021-08-28 DIAGNOSIS — R109 Unspecified abdominal pain: Secondary | ICD-10-CM | POA: Diagnosis not present

## 2021-08-28 DIAGNOSIS — Z3A17 17 weeks gestation of pregnancy: Secondary | ICD-10-CM | POA: Insufficient documentation

## 2021-08-28 DIAGNOSIS — R1030 Lower abdominal pain, unspecified: Secondary | ICD-10-CM | POA: Insufficient documentation

## 2021-08-28 LAB — URINALYSIS, ROUTINE W REFLEX MICROSCOPIC
Bilirubin Urine: NEGATIVE
Glucose, UA: NEGATIVE mg/dL
Hgb urine dipstick: NEGATIVE
Ketones, ur: NEGATIVE mg/dL
Nitrite: NEGATIVE
Protein, ur: NEGATIVE mg/dL
Specific Gravity, Urine: 1.014 (ref 1.005–1.030)
pH: 6 (ref 5.0–8.0)

## 2021-08-28 MED ORDER — SORBITOL 70 % SOLN
960.0000 mL | TOPICAL_OIL | Freq: Once | ORAL | Status: DC
  Filled 2021-08-28: qty 473

## 2021-08-28 NOTE — MAU Note (Signed)
Called pharmacy in search of SMOG enema for pt. Informed they will send once they "find it".  ?

## 2021-08-28 NOTE — Discharge Instructions (Signed)
You have constipation which is hard stools that are difficult to pass. It is important to have regular bowel movements every 1-3 days that are soft and easy to pass. Hard stools increase your risk of hemorrhoids and are very uncomfortable.   To prevent constipation you can increase the amount of fiber in your diet. Examples of foods with fiber are leafy greens, whole grain breads, oatmeal and other grains.  It is also important to drink at least eight 8oz glass of water everyday.   If you have not has a bowel movement in 4-5 days you made need to clean out your bowel.  This will have establish normal movement through your bowel.    Miralax Clean out Take 8 capfuls of miralax in 64 oz of gatorade. You can use any fluid that appeals to you (gatorade, water, juice) Continue to drink at least eight 8 oz glasses of water throughout the day You can repeat with another 8 capfuls of miralax in 64 oz of gatorade if you are not having a large amount of stools You will need to be at home and close to a bathroom for about 8 hours when you do the above as you may need to go to the bathroom frequently.   After you are cleaned out: - Start Colace100mg twice daily - Start Miralax once daily - Start a daily fiber supplement like metamucil or citrucel - You can safely use enemas in pregnancy  - if you are having diarrhea you can reduce to Colace once a day or miralax every other day or a 1/2 capful daily.                    Safe Medications in Pregnancy    Acne: Benzoyl Peroxide Salicylic Acid  Backache/Headache: Tylenol: 2 regular strength every 4 hours OR              2 Extra strength every 6 hours  Colds/Coughs/Allergies: Benadryl (alcohol free) 25 mg every 6 hours as needed Breath right strips Claritin Cepacol throat lozenges Chloraseptic throat spray Cold-Eeze- up to three times per day Cough drops, alcohol free Flonase (by prescription only) Guaifenesin Mucinex Robitussin DM (plain only,  alcohol free) Saline nasal spray/drops Sudafed (pseudoephedrine) & Actifed ** use only after [redacted] weeks gestation and if you do not have high blood pressure Tylenol Vicks Vaporub Zinc lozenges Zyrtec   Constipation: Colace Ducolax suppositories Fleet enema Glycerin suppositories Metamucil Milk of magnesia Miralax Senokot Smooth move tea  Diarrhea: Kaopectate Imodium A-D  *NO pepto Bismol  Hemorrhoids: Anusol Anusol HC Preparation H Tucks  Indigestion: Tums Maalox Mylanta Zantac  Pepcid  Insomnia: Benadryl (alcohol free) 25mg every 6 hours as needed Tylenol PM Unisom, no Gelcaps  Leg Cramps: Tums MagGel  Nausea/Vomiting:  Bonine Dramamine Emetrol Ginger extract Sea bands Meclizine  Nausea medication to take during pregnancy:  Unisom (doxylamine succinate 25 mg tablets) Take one tablet daily at bedtime. If symptoms are not adequately controlled, the dose can be increased to a maximum recommended dose of two tablets daily (1/2 tablet in the morning, 1/2 tablet mid-afternoon and one at bedtime). Vitamin B6 100mg tablets. Take one tablet twice a day (up to 200 mg per day).  Skin Rashes: Aveeno products Benadryl cream or 25mg every 6 hours as needed Calamine Lotion 1% cortisone cream  Yeast infection: Gyne-lotrimin 7 Monistat 7   **If taking multiple medications, please check labels to avoid duplicating the same active ingredients **take medication as directed on   the label ** Do not exceed 4000 mg of tylenol in 24 hours **Do not take medications that contain aspirin or ibuprofen    

## 2021-08-28 NOTE — MAU Note (Signed)
Mindy Key is a 21 y.o. at [redacted]w[redacted]d here in MAU reporting: hurting real bad at the bottom of her stomach. Has gotten real real bad today. No leaking or bleeding. No GI changes, denies urinary problems. ? ?Onset of complaint: started last night ?Pain score: 9 ?Vitals:  ? 08/28/21 1741  ?BP: 119/61  ?Pulse: 91  ?Resp: 17  ?Temp: 98.8 ?F (37.1 ?C)  ?SpO2: 99%  ?   ?FHT:155 ?Lab orders placed from triage:  urine ?

## 2021-08-28 NOTE — MAU Note (Signed)
Called pharmacy asking how much longer it would be for them to send SMOG enema after medication was due at 1830 and RN previously  called at 1935.. "tubing it to you now" ? ?CNM at pt's bedside and asked if she wanted to continue to wait for enema here or take a fleets enema at home. Pt not wanting to wait any longer and wants to due enema at home. CNM discontinued medication.  ?

## 2021-08-28 NOTE — MAU Provider Note (Signed)
?History  ?  ? ?CSN: [id number] ? ?Arrival date and time: 08/28/21 1718 ? ? None  ?  ? ?Chief Complaint  ?Patient presents with  ? Abdominal Pain  ? ?HPI ?Mindy Key is a 21 y.o. G3P1011 at [redacted]w[redacted]d who presents to MAU for lower abdominal pain. Patient reports sharp intermittent lower abdominal pain that started last night, however worsened today. Pain is worse with moving, sitting up, or standing up from seated position. She reports she got up to get a pizza earlier and it "felt like the baby dropped that pain was so sharp". She recently completed her medications for BV and is currently taking medication for yeast infection which she started last night. She denies vaginal bleeding or urinary s/s, but reports constipation. She reports her last BM was over 1 week ago. ? ?Patient receives prenatal care at [facility]. Next appointment is scheduled on 4/6. ? ?OB History   ? ? Gravida  ?3  ? Para  ?1  ? Term  ?1  ? Preterm  ?   ? AB  ?1  ? Living  ?1  ?  ? ? SAB  ?1  ? IAB  ?   ? Ectopic  ?   ? Multiple  ?   ? Live Births  ?1  ?   ?  ?  ? ? ?History reviewed. No pertinent past medical history. ? ?Past Surgical History:  ?Procedure Laterality Date  ? CESAREAN SECTION    ? ? ?Family History  ?Problem Relation Age of Onset  ? Healthy Mother   ? Healthy Father   ? Cancer Maternal Uncle   ? ? ?Social History  ? ?Tobacco Use  ? Smoking status: Former  ?  Types: Cigarettes  ?  Quit date: 04/05/2021  ?  Years since quitting: 0.3  ? Smokeless tobacco: Never  ?Vaping Use  ? Vaping Use: Never used  ?Substance Use Topics  ? Alcohol use: Not Currently  ? Drug use: Never  ? ? ?Allergies: No Known Allergies ? ?Medications Prior to Admission  ?Medication Sig Dispense Refill Last Dose  ? ferrous sulfate 325 (65 FE) MG EC tablet Take 1 tablet (325 mg total) by mouth every other day. 30 tablet 3 Past Week  ? metroNIDAZOLE (FLAGYL) 500 MG tablet Take 1 tablet (500 mg total) by mouth 2 (two) times daily. 14 tablet 0 Past Week  ? Prenatal Vit-Fe  Fumarate-FA (MULTIVITAMIN-PRENATAL) 27-0.8 MG TABS tablet Take 1 tablet by mouth daily at 12 noon.   08/27/2021  ? terconazole (TERAZOL 3) 0.8 % vaginal cream Place 1 applicator vaginally at bedtime. 20 g 0 08/27/2021  ? cephALEXin (KEFLEX) 500 MG capsule Take 1 capsule (500 mg total) by mouth 3 (three) times daily. 21 capsule 0   ? docusate sodium (COLACE) 250 MG capsule Take 1 capsule (250 mg total) by mouth 2 (two) times daily. (Patient not taking: Reported on 07/06/2021) 30 capsule 0   ? ? ?Review of Systems  ?Constitutional: Negative.   ?Respiratory: Negative.    ?Cardiovascular: Negative.   ?Gastrointestinal:  Positive for abdominal pain and constipation. Negative for diarrhea, nausea and vomiting.  ?Genitourinary: Negative.   ?Musculoskeletal: Negative.   ?Neurological: Negative.   ?Physical Exam  ? ?Blood pressure (!) 106/56, pulse 84, temperature 98.8 ?F (37.1 ?C), temperature source Oral, resp. rate 17, height 5\' 3"  (1.6 m), weight 79.2 kg, last menstrual period 04/24/2021, SpO2 99 %. ? ?Physical Exam ?Vitals and nursing note reviewed. Exam conducted with a chaperone  present.  ?Constitutional:   ?   General: She is not in acute distress. ?   Appearance: Normal appearance.  ?Eyes:  ?   Extraocular Movements: Extraocular movements intact.  ?   Pupils: Pupils are equal, round, and reactive to light.  ?Cardiovascular:  ?   Rate and Rhythm: Normal rate.  ?Pulmonary:  ?   Effort: Pulmonary effort is normal.  ?Abdominal:  ?   Palpations: Abdomen is soft.  ?   Tenderness: There is no abdominal tenderness.  ?   Comments: gravid  ?Genitourinary: ?   Comments: VE: closed/thick ?Musculoskeletal:     ?   General: Normal range of motion.  ?   Cervical back: Normal range of motion.  ?Skin: ?   General: Skin is warm and dry.  ?Neurological:  ?   General: No focal deficit present.  ?   Mental Status: She is alert and oriented to person, place, and time.  ?Psychiatric:     ?   Mood and Affect: Mood normal.     ?   Behavior:  Behavior normal.     ?   Thought Content: Thought content normal.     ?   Judgment: Judgment normal.  ? ?FHR: 155 bpm via doppler ? ?MAU Course  ?Procedures ? ?MDM ?UA, culture pending. Cervix closed. Pain likely related to round ligament pain vs constipation. SMOG enema was ordered however after waiting for over 2 hours for pharmacy to send, even after nursing staff called in regards to it, patient opted for discharge home and will do fleet's enema at home. Miralax cleanout prn, stool softeners, high fiber, and increased water intake also recommended. Patient also instructed Tylenol, heat, and support belt may be used for round ligament pain. Patient instructed to keep OB appointment as scheduled. ? ?Assessment and Plan  ?[redacted] weeks gestation of pregnancy ?Abdominal pain affecting pregnancy ?Constipation ? ?- Discharge home in stable condition ?- Keep OB appointment as scheduled ?- Return to MAU sooner or as needed for worsening symptoms ? ? ?Brand Males, CNM ?08/28/2021, 8:32 PM  ?

## 2021-08-30 LAB — CULTURE, OB URINE: Culture: 50000 — AB

## 2021-08-31 ENCOUNTER — Ambulatory Visit (INDEPENDENT_AMBULATORY_CARE_PROVIDER_SITE_OTHER): Payer: Medicaid Other | Admitting: Obstetrics & Gynecology

## 2021-08-31 ENCOUNTER — Encounter: Payer: Self-pay | Admitting: Obstetrics & Gynecology

## 2021-08-31 VITALS — BP 112/63 | HR 97 | Wt 171.0 lb

## 2021-08-31 DIAGNOSIS — Z3A18 18 weeks gestation of pregnancy: Secondary | ICD-10-CM

## 2021-08-31 DIAGNOSIS — Z3480 Encounter for supervision of other normal pregnancy, unspecified trimester: Secondary | ICD-10-CM

## 2021-08-31 NOTE — Progress Notes (Signed)
? ?  PRENATAL VISIT NOTE ? ?Subjective:  ?Mindy Key is a 21 y.o. G3P1011 at [redacted]w[redacted]d being seen today for ongoing prenatal care.  She is currently monitored for the following issues for this low-risk pregnancy and has Supervision of other normal pregnancy, antepartum; History of cesarean section; GBS bacteriuria; and ASCUS with positive high risk HPV cervical on their problem list. ? ?Patient reports no complaints.  Contractions: Not present. Vag. Bleeding: None.  Movement: Present. Denies leaking of fluid.  ? ?The following portions of the patient's history were reviewed and updated as appropriate: allergies, current medications, past family history, past medical history, past social history, past surgical history and problem list.  ? ?Objective:  ? ?Vitals:  ? 08/31/21 1524  ?BP: 112/63  ?Pulse: 97  ?Weight: 171 lb (77.6 kg)  ? ? ?Fetal Status: Fetal Heart Rate (bpm): 159   Movement: Present    ? ?General:  Alert, oriented and cooperative. Patient is in no acute distress.  ?Skin: Skin is warm and dry. No rash noted.   ?Cardiovascular: Normal heart rate noted  ?Respiratory: Normal respiratory effort, no problems with respiration noted  ?Abdomen: Soft, gravid, appropriate for gestational age.  Pain/Pressure: Present     ?Pelvic: Cervical exam deferred        ?Extremities: Normal range of motion.  Edema: None  ?Mental Status: Normal mood and affect. Normal behavior. Normal judgment and thought content.  ? ?Assessment and Plan:  ?Pregnancy: G3P1011 at [redacted]w[redacted]d ?1. [redacted] weeks gestation of pregnancy ?2. Supervision of other normal pregnancy, antepartum ?Low risk NIPS. Anatomy scan already scheduled. ?- AFP, Serum, Open Spina Bifida ?No other complaints or concerns.  Routine obstetric precautions reviewed. ? ?Please refer to After Visit Summary for other counseling recommendations.  ? ?Return in about 4 weeks (around 09/28/2021). ? ?Future Appointments  ?Date Time Provider Department Center  ?09/05/2021  1:15 PM [facility] NURSE  [facility]  ?09/05/2021  1:30 PM [facility] US3 [facility]  ? ? ?Jaynie Collins, MD ? ?

## 2021-09-02 LAB — AFP, SERUM, OPEN SPINA BIFIDA
AFP MoM: 0.7
AFP Value: 32.4 ng/mL
Gest. Age on Collection Date: 18.2 weeks
Maternal Age At EDD: 23.4 yr
OSBR Risk 1 IN: 10000
Test Results:: NEGATIVE
Weight: 171 [lb_av]

## 2021-09-05 ENCOUNTER — Ambulatory Visit: Payer: Medicaid Other | Admitting: *Deleted

## 2021-09-05 ENCOUNTER — Ambulatory Visit: Payer: Medicaid Other | Attending: Family Medicine

## 2021-09-05 VITALS — BP 109/61 | HR 82

## 2021-09-05 DIAGNOSIS — Z6830 Body mass index (BMI) 30.0-30.9, adult: Secondary | ICD-10-CM

## 2021-09-05 DIAGNOSIS — O99212 Obesity complicating pregnancy, second trimester: Secondary | ICD-10-CM | POA: Diagnosis not present

## 2021-09-05 DIAGNOSIS — Z3480 Encounter for supervision of other normal pregnancy, unspecified trimester: Secondary | ICD-10-CM

## 2021-09-05 DIAGNOSIS — O34219 Maternal care for unspecified type scar from previous cesarean delivery: Secondary | ICD-10-CM

## 2021-09-05 DIAGNOSIS — Z3A19 19 weeks gestation of pregnancy: Secondary | ICD-10-CM | POA: Insufficient documentation

## 2021-09-05 DIAGNOSIS — Z363 Encounter for antenatal screening for malformations: Secondary | ICD-10-CM | POA: Diagnosis not present

## 2021-09-06 ENCOUNTER — Other Ambulatory Visit: Payer: Self-pay | Admitting: *Deleted

## 2021-09-06 DIAGNOSIS — O99212 Obesity complicating pregnancy, second trimester: Secondary | ICD-10-CM

## 2021-09-07 ENCOUNTER — Encounter: Payer: Self-pay | Admitting: Family Medicine

## 2021-09-08 ENCOUNTER — Other Ambulatory Visit: Payer: Self-pay | Admitting: *Deleted

## 2021-09-08 DIAGNOSIS — B3731 Acute candidiasis of vulva and vagina: Secondary | ICD-10-CM

## 2021-09-08 MED ORDER — FLUCONAZOLE 150 MG PO TABS: 150.0000 mg | ORAL_TABLET | Freq: Once | ORAL | 0 refills | Status: AC

## 2021-09-28 ENCOUNTER — Ambulatory Visit (INDEPENDENT_AMBULATORY_CARE_PROVIDER_SITE_OTHER): Payer: Medicaid Other | Admitting: Family Medicine

## 2021-09-28 VITALS — BP 118/65 | HR 82 | Wt 178.0 lb

## 2021-09-28 DIAGNOSIS — R87610 Atypical squamous cells of undetermined significance on cytologic smear of cervix (ASC-US): Secondary | ICD-10-CM

## 2021-09-28 DIAGNOSIS — Z3480 Encounter for supervision of other normal pregnancy, unspecified trimester: Secondary | ICD-10-CM

## 2021-09-28 DIAGNOSIS — Z98891 History of uterine scar from previous surgery: Secondary | ICD-10-CM

## 2021-09-28 DIAGNOSIS — R87810 Cervical high risk human papillomavirus (HPV) DNA test positive: Secondary | ICD-10-CM

## 2021-09-28 DIAGNOSIS — R8271 Bacteriuria: Secondary | ICD-10-CM

## 2021-09-28 NOTE — Patient Instructions (Signed)

## 2021-09-28 NOTE — Progress Notes (Signed)
? ?  PRENATAL VISIT NOTE ? ?Subjective:  ?Mindy Key is a 21 y.o. G3P1011 at [redacted]w[redacted]d being seen today for ongoing prenatal care.  She is currently monitored for the following issues for this low-risk pregnancy and has Supervision of other normal pregnancy, antepartum; History of cesarean section; GBS bacteriuria; and ASCUS with positive high risk HPV cervical on their problem list. ? ?Patient reports no complaints.  Contractions: Not present. Vag. Bleeding: None.  Movement: Absent. Denies leaking of fluid.  ? ?The following portions of the patient's history were reviewed and updated as appropriate: allergies, current medications, past family history, past medical history, past social history, past surgical history and problem list.  ? ?Objective:  ? ?Vitals:  ? 09/28/21 1336  ?BP: 118/65  ?Pulse: 82  ?Weight: 178 lb (80.7 kg)  ? ? ?Fetal Status: Fetal Heart Rate (bpm): 150 Fundal Height: 22 cm Movement: Absent    ? ?General:  Alert, oriented and cooperative. Patient is in no acute distress.  ?Skin: Skin is warm and dry. No rash noted.   ?Cardiovascular: Normal heart rate noted  ?Respiratory: Normal respiratory effort, no problems with respiration noted  ?Abdomen: Soft, gravid, appropriate for gestational age.  Pain/Pressure: Present     ?Pelvic: Cervical exam deferred        ?Extremities: Normal range of motion.  Edema: None  ?Mental Status: Normal mood and affect. Normal behavior. Normal judgment and thought content.  ? ?Assessment and Plan:  ?Pregnancy: G3P1011 at [redacted]w[redacted]d ?1. GBS bacteriuria ?Will need treatment in labor ? ?2. Supervision of other normal pregnancy, antepartum ?Continue routine prenatal care. ?28 wk labs next ? ?3. History of cesarean section ?Undecided about route of delivery ? ?4. ASCUS with positive high risk HPV cervical ?Needs pap pp ? ?Preterm labor symptoms and general obstetric precautions including but not limited to vaginal bleeding, contractions, leaking of fluid and fetal movement were  reviewed in detail with the patient. ?Please refer to After Visit Summary for other counseling recommendations.  ? ?Return in 4 weeks (on 10/26/2021) for [facility], 28 wk labs. ? ?Future Appointments  ?Date Time Provider [place]  ?10/03/2021  3:15 PM [facility] NURSE [facility]  ?10/03/2021  3:30 PM [facility] US3 [facility]  ? ? ?Donnamae Jude, MD ? ?

## 2021-09-29 NOTE — Addendum Note (Signed)
Addended by: Reva Bores on: 09/29/2021 01:00 AM ? ? Modules accepted: Level of Service ? ?

## 2021-10-03 ENCOUNTER — Ambulatory Visit: Payer: Medicaid Other | Admitting: *Deleted

## 2021-10-03 ENCOUNTER — Ambulatory Visit: Payer: Medicaid Other | Attending: Obstetrics

## 2021-10-03 VITALS — BP 125/63 | HR 80

## 2021-10-03 DIAGNOSIS — Z3A23 23 weeks gestation of pregnancy: Secondary | ICD-10-CM | POA: Diagnosis not present

## 2021-10-03 DIAGNOSIS — O34219 Maternal care for unspecified type scar from previous cesarean delivery: Secondary | ICD-10-CM | POA: Diagnosis not present

## 2021-10-03 DIAGNOSIS — O358XX0 Maternal care for other (suspected) fetal abnormality and damage, not applicable or unspecified: Secondary | ICD-10-CM | POA: Diagnosis not present

## 2021-10-03 DIAGNOSIS — O99212 Obesity complicating pregnancy, second trimester: Secondary | ICD-10-CM

## 2021-10-03 DIAGNOSIS — Z3480 Encounter for supervision of other normal pregnancy, unspecified trimester: Secondary | ICD-10-CM | POA: Insufficient documentation

## 2021-10-03 DIAGNOSIS — E669 Obesity, unspecified: Secondary | ICD-10-CM

## 2021-10-30 ENCOUNTER — Other Ambulatory Visit (HOSPITAL_COMMUNITY)
Admission: RE | Admit: 2021-10-30 | Discharge: 2021-10-30 | Disposition: A | Discharge status: Home / Self Care | Payer: Medicaid Other | Source: Ambulatory Visit | Attending: Obstetrics and Gynecology | Admitting: Obstetrics and Gynecology

## 2021-10-30 ENCOUNTER — Other Ambulatory Visit: Payer: Medicaid Other

## 2021-10-30 ENCOUNTER — Other Ambulatory Visit: Payer: Self-pay | Admitting: *Deleted

## 2021-10-30 ENCOUNTER — Ambulatory Visit (INDEPENDENT_AMBULATORY_CARE_PROVIDER_SITE_OTHER): Payer: Medicaid Other | Admitting: Obstetrics and Gynecology

## 2021-10-30 VITALS — BP 119/60 | HR 87 | Wt 184.8 lb

## 2021-10-30 DIAGNOSIS — O26892 Other specified pregnancy related conditions, second trimester: Secondary | ICD-10-CM

## 2021-10-30 DIAGNOSIS — Z23 Encounter for immunization: Secondary | ICD-10-CM

## 2021-10-30 DIAGNOSIS — O23592 Infection of other part of genital tract in pregnancy, second trimester: Secondary | ICD-10-CM | POA: Diagnosis not present

## 2021-10-30 DIAGNOSIS — N898 Other specified noninflammatory disorders of vagina: Secondary | ICD-10-CM | POA: Insufficient documentation

## 2021-10-30 DIAGNOSIS — R87810 Cervical high risk human papillomavirus (HPV) DNA test positive: Secondary | ICD-10-CM | POA: Diagnosis not present

## 2021-10-30 DIAGNOSIS — O99019 Anemia complicating pregnancy, unspecified trimester: Secondary | ICD-10-CM

## 2021-10-30 DIAGNOSIS — N858 Other specified noninflammatory disorders of uterus: Secondary | ICD-10-CM | POA: Diagnosis not present

## 2021-10-30 DIAGNOSIS — O34219 Maternal care for unspecified type scar from previous cesarean delivery: Secondary | ICD-10-CM | POA: Diagnosis not present

## 2021-10-30 DIAGNOSIS — R87610 Atypical squamous cells of undetermined significance on cytologic smear of cervix (ASC-US): Secondary | ICD-10-CM | POA: Insufficient documentation

## 2021-10-30 DIAGNOSIS — Z3A26 26 weeks gestation of pregnancy: Secondary | ICD-10-CM

## 2021-10-30 DIAGNOSIS — Z3480 Encounter for supervision of other normal pregnancy, unspecified trimester: Secondary | ICD-10-CM | POA: Diagnosis not present

## 2021-10-30 DIAGNOSIS — O99891 Other specified diseases and conditions complicating pregnancy: Secondary | ICD-10-CM | POA: Insufficient documentation

## 2021-10-30 DIAGNOSIS — Z98891 History of uterine scar from previous surgery: Secondary | ICD-10-CM

## 2021-10-30 NOTE — Progress Notes (Signed)
   PRENATAL VISIT NOTE  Subjective:  Mindy Key is a 21 y.o. G3P1011 at [redacted]w[redacted]d being seen today for ongoing prenatal care.  She is currently monitored for the following issues for this low-risk pregnancy and has Supervision of other normal pregnancy, antepartum; History of cesarean section; GBS bacteriuria; and ASCUS with positive high risk HPV cervical on their problem list.  Patient reports  continued vaginitis and d/c despite PO meds .  Contractions: Not present. Vag. Bleeding: None.  Movement: Present. Denies leaking of fluid.   The following portions of the patient's history were reviewed and updated as appropriate: allergies, current medications, past family history, past medical history, past social history, past surgical history and problem list.   Objective:   Vitals:   10/30/21 0825  BP: 119/60  Pulse: 87  Weight: 184 lb 12.8 oz (83.8 kg)    Fetal Status: Fetal Heart Rate (bpm): 145 Fundal Height: 27 cm Movement: Present     General:  Alert, oriented and cooperative. Patient is in no acute distress.  Skin: Skin is warm and dry. No rash noted.   Cardiovascular: Normal heart rate noted  Respiratory: Normal respiratory effort, no problems with respiration noted  Abdomen: Soft, gravid, appropriate for gestational age.  Pain/Pressure: Present     Pelvic: Cervical exam deferred        Extremities: Normal range of motion.  Edema: None  Mental Status: Normal mood and affect. Normal behavior. Normal judgment and thought content.   Assessment and Plan:  Pregnancy: G3P1011 at [redacted]w[redacted]d 1. Supervision of other normal pregnancy, antepartum 28wk labs today. Completion anatomy u/s wnl. Repeat prn - Tdap vaccine greater than or equal to 7yo IM  2. [redacted] weeks gestation of pregnancy - Cervicovaginal ancillary only( [place])  3. History of cesarean section 2016 LTCS for breech. I told her that she can do tolac based on incision but will d/w her more re: pros/cons next visit  4. ASCUS  with positive high risk HPV cervical [facility] pp  5. Vaginal discharge in pregnancy in second trimester Self swab today. +bv and yeast in march. I told her that may be recurrent issue during pregnancy and if still+ will likely need to be on vag cream 2-3x/wk for rest of pregnancy - Cervicovaginal ancillary only( [place])  6. Vaginitis affecting pregnancy in second trimester, antepartum - Cervicovaginal ancillary only( [place])  Preterm labor symptoms and general obstetric precautions including but not limited to vaginal bleeding, contractions, leaking of fluid and fetal movement were reviewed in detail with the patient. Please refer to After Visit Summary for other counseling recommendations.   Return in about 3 weeks (around 11/20/2021) for in person, high risk ob, md visit.  Future Appointments  Date Time Provider Department Center  10/30/2021  8:50 AM [facility] LAB [facility]  10/30/2021  9:15 AM [place] Bing, MD [facility]  11/27/2021 11:15 AM [place] Bing, MD [facility]    [place] Bing, MD

## 2021-10-30 NOTE — Progress Notes (Signed)
Pt states still is having yellowish, thick d/c, pt states that she's had a yeast infection 3xs now & no treatment is working.

## 2021-10-31 ENCOUNTER — Other Ambulatory Visit: Payer: Self-pay | Admitting: Obstetrics and Gynecology

## 2021-10-31 DIAGNOSIS — O24419 Gestational diabetes mellitus in pregnancy, unspecified control: Secondary | ICD-10-CM | POA: Insufficient documentation

## 2021-10-31 DIAGNOSIS — Z8632 Personal history of gestational diabetes: Secondary | ICD-10-CM | POA: Insufficient documentation

## 2021-10-31 DIAGNOSIS — O99810 Abnormal glucose complicating pregnancy: Secondary | ICD-10-CM

## 2021-10-31 LAB — CERVICOVAGINAL ANCILLARY ONLY
Bacterial Vaginitis (gardnerella): POSITIVE — AB
Candida Glabrata: NEGATIVE
Candida Vaginitis: POSITIVE — AB
Chlamydia: NEGATIVE
Comment: NEGATIVE
Comment: NEGATIVE
Comment: NEGATIVE
Comment: NEGATIVE
Comment: NEGATIVE
Comment: NORMAL
Neisseria Gonorrhea: NEGATIVE
Trichomonas: NEGATIVE

## 2021-10-31 LAB — CBC
Hematocrit: 28.3 % — ABNORMAL LOW (ref 34.0–46.6)
Hemoglobin: 9.2 g/dL — ABNORMAL LOW (ref 11.1–15.9)
MCH: 26.9 pg (ref 26.6–33.0)
MCHC: 32.5 g/dL (ref 31.5–35.7)
MCV: 83 fL (ref 79–97)
Platelets: 173 10*3/uL (ref 150–450)
RBC: 3.42 x10E6/uL — ABNORMAL LOW (ref 3.77–5.28)
RDW: 14.4 % (ref 11.7–15.4)
WBC: 11.8 10*3/uL — ABNORMAL HIGH (ref 3.4–10.8)

## 2021-10-31 LAB — GLUCOSE TOLERANCE, 2 HOURS W/ 1HR
Glucose, 1 hour: 80 mg/dL (ref 70–179)
Glucose, 2 hour: 82 mg/dL (ref 70–152)
Glucose, Fasting: 93 mg/dL — ABNORMAL HIGH (ref 70–91)

## 2021-10-31 LAB — RPR: RPR Ser Ql: NONREACTIVE

## 2021-10-31 LAB — HIV ANTIBODY (ROUTINE TESTING W REFLEX): HIV Screen 4th Generation wRfx: NONREACTIVE

## 2021-10-31 MED ORDER — MICONAZOLE NITRATE 2 % VA CREA: 1.0000 | TOPICAL_CREAM | Freq: Every day | VAGINAL | 2 refills | Status: DC

## 2021-10-31 MED ORDER — METRONIDAZOLE 500 MG PO TABS: 500.0000 mg | ORAL_TABLET | Freq: Two times a day (BID) | ORAL | 0 refills | Status: AC

## 2021-10-31 NOTE — Addendum Note (Signed)
Addended by: Elbert Bing on: 10/31/2021 06:39 PM   Modules accepted: Orders

## 2021-11-01 ENCOUNTER — Telehealth: Payer: Self-pay | Admitting: Family Medicine

## 2021-11-01 NOTE — Telephone Encounter (Signed)
Attempted to call patient to schedule 1 hour, there was no answer to the phone call and the option to leave a voicemail was unavailable.

## 2021-11-02 ENCOUNTER — Telehealth: Payer: Self-pay | Admitting: Family Medicine

## 2021-11-02 LAB — ANEMIA PROFILE B
Basophils Absolute: 0 10*3/uL (ref 0.0–0.2)
Basos: 0 %
EOS (ABSOLUTE): 0.1 10*3/uL (ref 0.0–0.4)
Eos: 1 %
Ferritin: 9 ng/mL — ABNORMAL LOW (ref 15–150)
Folate: 5.9 ng/mL (ref >3.0)
Hematocrit: 29.5 % — ABNORMAL LOW (ref 34.0–46.6)
Hemoglobin: 9.3 g/dL — ABNORMAL LOW (ref 11.1–15.9)
Immature Grans (Abs): 0.1 10*3/uL (ref 0.0–0.1)
Immature Granulocytes: 1 %
Iron Saturation: 5 % — CL (ref 15–55)
Iron: 22 ug/dL — ABNORMAL LOW (ref 27–159)
Lymphocytes Absolute: 2.5 10*3/uL (ref 0.7–3.1)
Lymphs: 21 %
MCH: 26.4 pg — ABNORMAL LOW (ref 26.6–33.0)
MCHC: 31.5 g/dL (ref 31.5–35.7)
MCV: 84 fL (ref 79–97)
Monocytes Absolute: 0.9 10*3/uL (ref 0.1–0.9)
Monocytes: 7 %
Neutrophils Absolute: 8.3 10*3/uL — ABNORMAL HIGH (ref 1.4–7.0)
Neutrophils: 70 %
Platelets: 171 10*3/uL (ref 150–450)
RBC: 3.52 x10E6/uL — ABNORMAL LOW (ref 3.77–5.28)
RDW: 15 % (ref 11.7–15.4)
Retic Ct Pct: 2 % (ref 0.6–2.6)
Total Iron Binding Capacity: 419 ug/dL (ref 250–450)
UIBC: 397 ug/dL (ref 131–425)
Vitamin B-12: 539 pg/mL (ref 232–1245)
WBC: 11.9 10*3/uL — ABNORMAL HIGH (ref 3.4–10.8)

## 2021-11-02 LAB — SPECIMEN STATUS REPORT

## 2021-11-02 NOTE — Telephone Encounter (Signed)
Called patient to schedule 1 hour glucose test, there was no answer to the phone call and the call ended with the message of invalid number. A mychart message was sent to the patient to call the office.

## 2021-11-04 ENCOUNTER — Encounter: Payer: Self-pay | Admitting: Obstetrics and Gynecology

## 2021-11-04 ENCOUNTER — Other Ambulatory Visit: Payer: Self-pay | Admitting: Obstetrics and Gynecology

## 2021-11-04 DIAGNOSIS — O99019 Anemia complicating pregnancy, unspecified trimester: Secondary | ICD-10-CM | POA: Insufficient documentation

## 2021-11-06 ENCOUNTER — Telehealth: Payer: Self-pay

## 2021-11-06 ENCOUNTER — Telehealth: Payer: Self-pay | Admitting: *Deleted

## 2021-11-06 NOTE — Telephone Encounter (Signed)
MyChart alert that patient has not reviewed results of GTT. Called pt, message received stating call cannot be completed. MyChart message sent. Patient contact call; VM left.

## 2021-11-06 NOTE — Telephone Encounter (Signed)
Message received call cannot be completed as dialed. Will review at upcoming provider appt.

## 2021-11-06 NOTE — Telephone Encounter (Signed)
Had wet prep 10/30/21 + for BV and yeast, message sent by provider 10/31/21 to notify patient of results and that RX sent. Patient has not read message. Needs to be called. Nancy Fetter

## 2021-11-27 ENCOUNTER — Encounter: Payer: Medicaid Other | Admitting: Obstetrics and Gynecology

## 2021-12-15 ENCOUNTER — Ambulatory Visit (INDEPENDENT_AMBULATORY_CARE_PROVIDER_SITE_OTHER): Payer: Medicaid Other | Admitting: Certified Nurse Midwife

## 2021-12-15 ENCOUNTER — Other Ambulatory Visit: Payer: Self-pay

## 2021-12-15 VITALS — BP 104/69 | HR 91 | Wt 194.5 lb

## 2021-12-15 DIAGNOSIS — B3731 Acute candidiasis of vulva and vagina: Secondary | ICD-10-CM

## 2021-12-15 DIAGNOSIS — O0993 Supervision of high risk pregnancy, unspecified, third trimester: Secondary | ICD-10-CM

## 2021-12-15 DIAGNOSIS — O219 Vomiting of pregnancy, unspecified: Secondary | ICD-10-CM

## 2021-12-15 DIAGNOSIS — K219 Gastro-esophageal reflux disease without esophagitis: Secondary | ICD-10-CM

## 2021-12-15 DIAGNOSIS — Z98891 History of uterine scar from previous surgery: Secondary | ICD-10-CM

## 2021-12-15 DIAGNOSIS — Z3A33 33 weeks gestation of pregnancy: Secondary | ICD-10-CM

## 2021-12-15 MED ORDER — FAMOTIDINE 10 MG PO TABS: 10.0000 mg | ORAL_TABLET | Freq: Two times a day (BID) | ORAL | 1 refills | Status: DC | PRN

## 2021-12-15 MED ORDER — ONDANSETRON HCL 4 MG PO TABS: 4.0000 mg | ORAL_TABLET | Freq: Three times a day (TID) | ORAL | 0 refills | Status: DC | PRN

## 2021-12-15 NOTE — Progress Notes (Signed)
   PRENATAL VISIT NOTE  Subjective:  Mindy Key is a 21 y.o. G3P1011 at [redacted]w[redacted]d being seen today for ongoing prenatal care.  She is currently monitored for the following issues for this high-risk pregnancy and has Supervision of other normal pregnancy, antepartum; History of cesarean section; GBS bacteriuria; ASCUS with positive high risk HPV cervical; Abnormal glucose affecting pregnancy; and Anemia in pregnancy on their problem list.  Patient reports vomiting and acid reflux   after mealtime. Otherwise no complaints.   Contractions: Not present. Vag. Bleeding: None.  Movement: Present. Denies leaking of fluid.   The following portions of the patient's history were reviewed and updated as appropriate: allergies, current medications, past family history, past medical history, past social history, past surgical history and problem list.   Objective:   Vitals:   12/15/21 0903  BP: 104/69  Pulse: 91  Weight: 194 lb 8 oz (88.2 kg)    Fetal Status: Fetal Heart Rate (bpm): 144 Fundal Height: 34 cm Movement: Present     General:  Alert, oriented and cooperative. Patient is in no acute distress.  Skin: Skin is warm and dry. No rash noted.   Cardiovascular: Normal heart rate noted  Respiratory: Normal respiratory effort, no problems with respiration noted  Abdomen: Soft, gravid, appropriate for gestational age.  Pain/Pressure: Present     Pelvic: Cervical exam deferred        Extremities: Normal range of motion.  Edema: None  Mental Status: Normal mood and affect. Normal behavior. Normal judgment and thought content.   Assessment and Plan:  Pregnancy: G3P1011 at [redacted]w[redacted]d 1. Supervision of high risk pregnancy in third trimester - Patient feeling frequent and vigorous fetal movement. - Discussed the Prenatal Schedule change from monthly to bi-weekly visits from now until 36 weeks.     2. [redacted] weeks gestation of pregnancy - Patient needs a repeat GTT. Was unable to stay for the test today.  -  Repeat 1hr GTT scheduled for next visit. Patient informed to Fast after midnight, the night before the test for most accurate results.  - Undecided on contraception following delivery. IUD, Nexplanon, Pills, Depo, Nuva-Ring, Condoms, and natural family planning briefly discussed. Pt leaning towards Depo.   3. History of cesarean section - Patient had questions about a waterbirth.  Discussed that patient has risked out of WB due to hx of C/S.  - CNM discussed TOLAC vs R/p C/S. CNM discussed risks and benefits of TOLAC, as well as pain management options.   - Patient currently desiring TOLAC. Educational information provided.  - Plan for TOLAC consents at next visit.    4. Yeast vaginitis - No complaints at this time.   5. Acid Reflux and Vomiting  - Prescription for Zofran sent to outpatient pharmacy.  - Discussed OTC Pepcid for reflux.   Preterm labor symptoms and general obstetric precautions including but not limited to vaginal bleeding, contractions, leaking of fluid and fetal movement were reviewed in detail with the patient. Please refer to After Visit Summary for other counseling recommendations.   Return in about 2 weeks (around 12/29/2021) for HROB w GTT.  Future Appointments  Date Time Provider Department Center  01/01/2022  8:50 AM [facility] LAB [facility]  01/01/2022  9:15 AM Corlis Hove, NP [facility]    Bubba Vanbenschoten Danella Deis) Suzie Portela, MSN, CNM  Center for [facility]  12/15/21 11:17 AM

## 2022-01-01 ENCOUNTER — Other Ambulatory Visit: Payer: Medicaid Other

## 2022-01-01 ENCOUNTER — Encounter: Payer: Medicaid Other | Admitting: Student

## 2022-01-04 ENCOUNTER — Other Ambulatory Visit: Payer: Self-pay

## 2022-01-04 DIAGNOSIS — Z3480 Encounter for supervision of other normal pregnancy, unspecified trimester: Secondary | ICD-10-CM

## 2022-01-08 ENCOUNTER — Other Ambulatory Visit: Payer: Medicaid Other

## 2022-01-08 ENCOUNTER — Ambulatory Visit (INDEPENDENT_AMBULATORY_CARE_PROVIDER_SITE_OTHER): Payer: Medicaid Other | Admitting: Student

## 2022-01-08 ENCOUNTER — Other Ambulatory Visit: Payer: Self-pay

## 2022-01-08 ENCOUNTER — Other Ambulatory Visit (HOSPITAL_COMMUNITY)
Admission: RE | Admit: 2022-01-08 | Discharge: 2022-01-08 | Disposition: A | Discharge status: Home / Self Care | Payer: Medicaid Other | Source: Ambulatory Visit | Attending: Student | Admitting: Student

## 2022-01-08 VITALS — BP 121/52 | HR 81 | Wt 201.5 lb

## 2022-01-08 DIAGNOSIS — Z3A36 36 weeks gestation of pregnancy: Secondary | ICD-10-CM

## 2022-01-08 DIAGNOSIS — Z3483 Encounter for supervision of other normal pregnancy, third trimester: Secondary | ICD-10-CM

## 2022-01-08 DIAGNOSIS — O26893 Other specified pregnancy related conditions, third trimester: Secondary | ICD-10-CM | POA: Insufficient documentation

## 2022-01-08 DIAGNOSIS — Z3480 Encounter for supervision of other normal pregnancy, unspecified trimester: Secondary | ICD-10-CM

## 2022-01-08 DIAGNOSIS — Z98891 History of uterine scar from previous surgery: Secondary | ICD-10-CM

## 2022-01-08 NOTE — Progress Notes (Signed)
   PRENATAL VISIT NOTE  Subjective:  Mindy Key is a 21 y.o. G3P1011 at [redacted]w[redacted]d being seen today for ongoing prenatal care.  She is currently monitored for the following issues for this high-risk pregnancy and has Supervision of other normal pregnancy, antepartum; History of cesarean section; GBS bacteriuria; ASCUS with positive high risk HPV cervical; Abnormal glucose affecting pregnancy; and Anemia in pregnancy on their problem list.  Patient reports no complaints.  Contractions: Irritability. Vag. Bleeding: None.  Movement: Present. Denies leaking of fluid.   The following portions of the patient's history were reviewed and updated as appropriate: allergies, current medications, past family history, past medical history, past social history, past surgical history and problem list.   Objective:   Vitals:   01/08/22 0913  BP: (!) 121/52  Pulse: 81  Weight: 201 lb 8 oz (91.4 kg)    Fetal Status: Fetal Heart Rate (bpm): 152 Fundal Height: 36 cm Movement: Present  Presentation: Vertex  General:  Alert, oriented and cooperative. Patient is in no acute distress.  Skin: Skin is warm and dry. No rash noted.   Cardiovascular: Normal heart rate noted  Respiratory: Normal respiratory effort, no problems with respiration noted  Abdomen: Soft, gravid, appropriate for gestational age.  Pain/Pressure: Present     Pelvic: Cervical exam deferred        Extremities: Normal range of motion.  Edema: None  Mental Status: Normal mood and affect. Normal behavior. Normal judgment and thought content.   Assessment and Plan:  Pregnancy: G3P1011 at [redacted]w[redacted]d 1. Supervision of other normal pregnancy, antepartum - Feeling well today, reports frequent movement - Repeat GTT w/ 1hr collected today at recommendation of Dr. Vergie Living. Patient states she drank ginger ale this morning a few hours prior to appointment.   2. [redacted] weeks gestation of pregnancy -  +GBS Urine in pregnancy -  Cervicovaginal ancillary only(  [place])  3. History of cesarean section - Needs TOLAC consent - request for MD next appointment  Preterm labor symptoms and general obstetric precautions including but not limited to vaginal bleeding, contractions, leaking of fluid and fetal movement were reviewed in detail with the patient. Please refer to After Visit Summary for other counseling recommendations.   Return in about 1 week (around 01/15/2022) for with MD, IN-PERSON, HOB.  Future Appointments  Date Time Provider Department Center  01/17/2022  1:35 PM [place] Bing, MD [facility]    Corlis Hove, NP

## 2022-01-08 NOTE — Progress Notes (Signed)
Pt reports a lot of pain & pressure in lower abdomen & back.

## 2022-01-09 LAB — CERVICOVAGINAL ANCILLARY ONLY
Chlamydia: NEGATIVE
Comment: NEGATIVE
Comment: NORMAL
Neisseria Gonorrhea: NEGATIVE

## 2022-01-09 LAB — GLUCOSE TOLERANCE, 2 HOURS W/ 1HR
Glucose, 1 hour: 99 mg/dL (ref 70–179)
Glucose, 2 hour: 91 mg/dL (ref 70–152)
Glucose, Fasting: 97 mg/dL — ABNORMAL HIGH (ref 70–91)

## 2022-01-17 ENCOUNTER — Encounter: Payer: Medicaid Other | Admitting: Obstetrics and Gynecology

## 2022-01-22 ENCOUNTER — Ambulatory Visit (INDEPENDENT_AMBULATORY_CARE_PROVIDER_SITE_OTHER): Payer: PRIVATE HEALTH INSURANCE | Admitting: Obstetrics and Gynecology

## 2022-01-22 ENCOUNTER — Encounter: Payer: Self-pay | Admitting: Certified Nurse Midwife

## 2022-01-22 VITALS — BP 125/86 | HR 78 | Wt 203.0 lb

## 2022-01-22 DIAGNOSIS — R87610 Atypical squamous cells of undetermined significance on cytologic smear of cervix (ASC-US): Secondary | ICD-10-CM

## 2022-01-22 DIAGNOSIS — Z98891 History of uterine scar from previous surgery: Secondary | ICD-10-CM

## 2022-01-22 DIAGNOSIS — O99013 Anemia complicating pregnancy, third trimester: Secondary | ICD-10-CM | POA: Diagnosis not present

## 2022-01-22 DIAGNOSIS — R8271 Bacteriuria: Secondary | ICD-10-CM | POA: Diagnosis not present

## 2022-01-22 DIAGNOSIS — O99810 Abnormal glucose complicating pregnancy: Secondary | ICD-10-CM

## 2022-01-22 DIAGNOSIS — Z3A38 38 weeks gestation of pregnancy: Secondary | ICD-10-CM

## 2022-01-22 DIAGNOSIS — R87810 Cervical high risk human papillomavirus (HPV) DNA test positive: Secondary | ICD-10-CM

## 2022-01-22 DIAGNOSIS — O24419 Gestational diabetes mellitus in pregnancy, unspecified control: Secondary | ICD-10-CM

## 2022-01-22 LAB — GLUCOSE, CAPILLARY: Glucose-Capillary: 87 mg/dL (ref 70–99)

## 2022-01-22 NOTE — Progress Notes (Addendum)
PRENATAL VISIT NOTE  Subjective:  Mindy Key is a 21 y.o. G3P1011 at [redacted]w[redacted]d being seen today for ongoing prenatal care.  She is currently monitored for the following issues for this high-risk pregnancy and has Supervision of other normal pregnancy, antepartum; History of cesarean section; GBS bacteriuria; ASCUS with positive high risk HPV cervical; GDM (gestational diabetes mellitus); and Anemia in pregnancy on their problem list.  Patient reports no complaints.  Contractions: Irritability. Vag. Bleeding: None.  Movement: Present. Denies leaking of fluid.   The following portions of the patient's history were reviewed and updated as appropriate: allergies, current medications, past family history, past medical history, past social history, past surgical history and problem list.   Objective:   Vitals:   01/22/22 1116  BP: 125/86  Pulse: 78  Weight: 203 lb (92.1 kg)    Fetal Status: Fetal Heart Rate (bpm): 144 Fundal Height: 37 cm Movement: Present  Presentation: Vertex  General:  Alert, oriented and cooperative. Patient is in no acute distress.  Skin: Skin is warm and dry. No rash noted.   Cardiovascular: Normal heart rate noted  Respiratory: Normal respiratory effort, no problems with respiration noted  Abdomen: Soft, gravid, appropriate for gestational age.  Pain/Pressure: Present     Pelvic: Cervical exam performed in the presence of a chaperone Dilation: Fingertip Effacement (%): 50 Station: -3  Extremities: Normal range of motion.  Edema: None  Mental Status: Normal mood and affect. Normal behavior. Normal judgment and thought content.  Cephalic on bedside ultrasound, fluid subjectively normal.   Assessment and Plan:  Pregnancy: G3P1011 at [redacted]w[redacted]d 1. Anemia during pregnancy in third trimester Patient states not taking iron due to GI issues. Patient not set up for iron after 28wk labs and no mention of it in past few OB visits; pt states she never heard anything about IV  iron     Latest Ref Rng & Units 10/30/2021    8:11 AM 07/06/2021   10:07 AM 06/22/2021    4:17 PM  CBC  WBC 3.4 - 10.8 x10E3/uL 3.4 - 10.8 x10E3/uL 11.8    11.9  8.5  9.2   Hemoglobin 11.1 - 15.9 g/dL 16.1 - 09.6 g/dL 9.2    9.3  04.5  40.9   Hematocrit 34.0 - 46.6 % 34.0 - 46.6 % 28.3    29.5  33.9  35.0   Platelets 150 - 450 x10E3/uL 150 - 450 x10E3/uL 173    171  280  318    2. History of cesarean section 2016 for breech. R/b of rpt c/s vs TOLAC. I told her that IOL TOLAC has higher risk of rupture and she is okay with that. Consent signed today  3. GBS bacteriuria Treat in labor  4. ASCUS with positive high risk HPV cervical Repeat pap pp  5. Gestational diabetes mellitus (GDM) in third trimester, gestational diabetes method of control unspecified Pt had 6/5 fasting of 93 on 2h GTT and no show'ed to repeat GTTs until 8/14 and she also failed that fasting with 97. Her fasting today is 87 on FSBS. Since she has failed to prior fasting GTT and there is a not that she had some soda prior to her 8/14 test but she still had a fasting 2h.   Given all this, I would give her the diagnosis of GDM and recommend 39wk IOL, which was set up for 8/29 day time. FH normal and pt leopolds to 3500gm  6. [redacted] weeks gestation of pregnancy  Term  labor symptoms and general obstetric precautions including but not limited to vaginal bleeding, contractions, leaking of fluid and fetal movement were reviewed in detail with the patient. Please refer to After Visit Summary for other counseling recommendations.   No follow-ups on file.  No future appointments.  Craighead Bing, MD

## 2022-01-23 ENCOUNTER — Inpatient Hospital Stay (HOSPITAL_COMMUNITY)
Admission: AD | Admit: 2022-01-23 | Discharge: 2022-01-26 | DRG: 788 | Disposition: A | Discharge status: Home / Self Care | Payer: Medicaid Other | Attending: Family Medicine | Admitting: Family Medicine

## 2022-01-23 ENCOUNTER — Inpatient Hospital Stay (HOSPITAL_COMMUNITY): Payer: Medicaid Other

## 2022-01-23 ENCOUNTER — Inpatient Hospital Stay (HOSPITAL_COMMUNITY): Payer: Medicaid Other | Admitting: Anesthesiology

## 2022-01-23 ENCOUNTER — Encounter (HOSPITAL_COMMUNITY)
Admission: AD | Disposition: A | Discharge status: Home / Self Care | Payer: Self-pay | Source: Home / Self Care | Attending: Family Medicine

## 2022-01-23 ENCOUNTER — Other Ambulatory Visit: Payer: Self-pay

## 2022-01-23 ENCOUNTER — Encounter (HOSPITAL_COMMUNITY): Payer: Self-pay | Admitting: Obstetrics and Gynecology

## 2022-01-23 DIAGNOSIS — Z8632 Personal history of gestational diabetes: Secondary | ICD-10-CM | POA: Diagnosis present

## 2022-01-23 DIAGNOSIS — D649 Anemia, unspecified: Secondary | ICD-10-CM | POA: Diagnosis not present

## 2022-01-23 DIAGNOSIS — O99893 Other specified diseases and conditions complicating puerperium: Secondary | ICD-10-CM | POA: Diagnosis not present

## 2022-01-23 DIAGNOSIS — O9902 Anemia complicating childbirth: Secondary | ICD-10-CM

## 2022-01-23 DIAGNOSIS — O99824 Streptococcus B carrier state complicating childbirth: Secondary | ICD-10-CM | POA: Diagnosis present

## 2022-01-23 DIAGNOSIS — Z98891 History of uterine scar from previous surgery: Secondary | ICD-10-CM

## 2022-01-23 DIAGNOSIS — Z3A39 39 weeks gestation of pregnancy: Secondary | ICD-10-CM

## 2022-01-23 DIAGNOSIS — O24420 Gestational diabetes mellitus in childbirth, diet controlled: Principal | ICD-10-CM | POA: Diagnosis present

## 2022-01-23 DIAGNOSIS — Z87891 Personal history of nicotine dependence: Secondary | ICD-10-CM | POA: Diagnosis not present

## 2022-01-23 DIAGNOSIS — O34211 Maternal care for low transverse scar from previous cesarean delivery: Secondary | ICD-10-CM

## 2022-01-23 DIAGNOSIS — T68XXXA Hypothermia, initial encounter: Secondary | ICD-10-CM | POA: Diagnosis not present

## 2022-01-23 DIAGNOSIS — O24419 Gestational diabetes mellitus in pregnancy, unspecified control: Secondary | ICD-10-CM | POA: Diagnosis present

## 2022-01-23 DIAGNOSIS — Z3480 Encounter for supervision of other normal pregnancy, unspecified trimester: Principal | ICD-10-CM

## 2022-01-23 LAB — CBC WITH DIFFERENTIAL/PLATELET
Abs Immature Granulocytes: 0.06 10*3/uL (ref 0.00–0.07)
Basophils Absolute: 0 10*3/uL (ref 0.0–0.1)
Basophils Relative: 0 %
Eosinophils Absolute: 0 10*3/uL (ref 0.0–0.5)
Eosinophils Relative: 0 %
HCT: 27.3 % — ABNORMAL LOW (ref 36.0–46.0)
Hemoglobin: 8.8 g/dL — ABNORMAL LOW (ref 12.0–15.0)
Immature Granulocytes: 1 %
Lymphocytes Relative: 21 %
Lymphs Abs: 1.9 10*3/uL (ref 0.7–4.0)
MCH: 24.3 pg — ABNORMAL LOW (ref 26.0–34.0)
MCHC: 32.2 g/dL (ref 30.0–36.0)
MCV: 75.4 fL — ABNORMAL LOW (ref 80.0–100.0)
Monocytes Absolute: 0.3 10*3/uL (ref 0.1–1.0)
Monocytes Relative: 4 %
Neutro Abs: 6.9 10*3/uL (ref 1.7–7.7)
Neutrophils Relative %: 74 %
Platelets: 137 10*3/uL — ABNORMAL LOW (ref 150–400)
RBC: 3.62 MIL/uL — ABNORMAL LOW (ref 3.87–5.11)
RDW: 16.8 % — ABNORMAL HIGH (ref 11.5–15.5)
WBC: 9.3 10*3/uL (ref 4.0–10.5)
nRBC: 0 % (ref 0.0–0.2)

## 2022-01-23 LAB — COMPREHENSIVE METABOLIC PANEL
ALT: 11 U/L (ref 0–44)
AST: 16 U/L (ref 15–41)
Albumin: 2.7 g/dL — ABNORMAL LOW (ref 3.5–5.0)
Alkaline Phosphatase: 136 U/L — ABNORMAL HIGH (ref 38–126)
Anion gap: 5 (ref 5–15)
BUN: <5 mg/dL — ABNORMAL LOW (ref 6–20)
CO2: 20 mmol/L — ABNORMAL LOW (ref 22–32)
Calcium: 8.6 mg/dL — ABNORMAL LOW (ref 8.9–10.3)
Chloride: 109 mmol/L (ref 98–111)
Creatinine, Ser: 0.63 mg/dL (ref 0.44–1.00)
GFR, Estimated: >60 mL/min (ref >60)
Glucose, Bld: 87 mg/dL (ref 70–99)
Potassium: 4 mmol/L (ref 3.5–5.1)
Sodium: 134 mmol/L — ABNORMAL LOW (ref 135–145)
Total Bilirubin: 0.5 mg/dL (ref 0.3–1.2)
Total Protein: 5.9 g/dL — ABNORMAL LOW (ref 6.5–8.1)

## 2022-01-23 LAB — CBC
HCT: 28.6 % — ABNORMAL LOW (ref 36.0–46.0)
Hemoglobin: 9 g/dL — ABNORMAL LOW (ref 12.0–15.0)
MCH: 23.8 pg — ABNORMAL LOW (ref 26.0–34.0)
MCHC: 31.5 g/dL (ref 30.0–36.0)
MCV: 75.7 fL — ABNORMAL LOW (ref 80.0–100.0)
Platelets: 173 10*3/uL (ref 150–400)
RBC: 3.78 MIL/uL — ABNORMAL LOW (ref 3.87–5.11)
RDW: 16.9 % — ABNORMAL HIGH (ref 11.5–15.5)
WBC: 11.7 10*3/uL — ABNORMAL HIGH (ref 4.0–10.5)
nRBC: 0 % (ref 0.0–0.2)

## 2022-01-23 LAB — APTT: aPTT: 27 seconds (ref 24–36)

## 2022-01-23 LAB — PROTIME-INR
INR: 1.1 (ref 0.8–1.2)
Prothrombin Time: 14.3 seconds (ref 11.4–15.2)

## 2022-01-23 LAB — LACTIC ACID, PLASMA
Lactic Acid, Venous: 1.5 mmol/L (ref 0.5–1.9)
Lactic Acid, Venous: 1.2 mmol/L (ref 0.5–1.9)

## 2022-01-23 LAB — GLUCOSE, CAPILLARY
Glucose-Capillary: 93 mg/dL (ref 70–99)
Glucose-Capillary: 91 mg/dL (ref 70–99)

## 2022-01-23 LAB — RPR: RPR Ser Ql: NONREACTIVE

## 2022-01-23 SURGERY — Surgical Case
Anesthesia: Spinal

## 2022-01-23 MED ORDER — DIPHENHYDRAMINE HCL 25 MG PO CAPS: 25.0000 mg | ORAL_CAPSULE | Freq: Four times a day (QID) | ORAL | Status: DC | PRN

## 2022-01-23 MED ORDER — MORPHINE SULFATE (PF) 0.5 MG/ML IJ SOLN
INTRAMUSCULAR | Status: AC
  Filled 2022-01-23: qty 10

## 2022-01-23 MED ORDER — ALBUMIN HUMAN 5 % IV SOLN
INTRAVENOUS | Status: AC
  Filled 2022-01-23: qty 250

## 2022-01-23 MED ORDER — SENNOSIDES-DOCUSATE SODIUM 8.6-50 MG PO TABS
2.0000 | ORAL_TABLET | Freq: Every day | ORAL | Status: DC
  Administered 2022-01-24 – 2022-01-26 (×3): 2 via ORAL
  Filled 2022-01-23 (×3): qty 2

## 2022-01-23 MED ORDER — OXYCODONE HCL 5 MG/5ML PO SOLN
5.0000 mg | Freq: Once | ORAL | Status: DC | PRN
  Filled 2022-01-23: qty 5

## 2022-01-23 MED ORDER — ACETAMINOPHEN 325 MG PO TABS: 650.0000 mg | ORAL_TABLET | ORAL | Status: DC | PRN

## 2022-01-23 MED ORDER — MAGNESIUM HYDROXIDE 400 MG/5ML PO SUSP: 30.0000 mL | ORAL | Status: DC | PRN

## 2022-01-23 MED ORDER — MEPERIDINE HCL 25 MG/ML IJ SOLN: 6.2500 mg | INTRAMUSCULAR | Status: DC | PRN

## 2022-01-23 MED ORDER — STERILE WATER FOR IRRIGATION IR SOLN
Status: DC | PRN
  Administered 2022-01-23: 1000 mL

## 2022-01-23 MED ORDER — DEXAMETHASONE SODIUM PHOSPHATE 4 MG/ML IJ SOLN
INTRAMUSCULAR | Status: DC | PRN
  Administered 2022-01-23: 8 mg via INTRAVENOUS

## 2022-01-23 MED ORDER — DEXAMETHASONE SODIUM PHOSPHATE 4 MG/ML IJ SOLN
INTRAMUSCULAR | Status: AC
  Filled 2022-01-23: qty 2

## 2022-01-23 MED ORDER — PHENYLEPHRINE HCL-NACL 20-0.9 MG/250ML-% IV SOLN
INTRAVENOUS | Status: DC | PRN
  Administered 2022-01-23: 60 ug/min via INTRAVENOUS

## 2022-01-23 MED ORDER — FENTANYL CITRATE (PF) 100 MCG/2ML IJ SOLN
INTRAMUSCULAR | Status: DC | PRN
Start: 2022-01-23 — End: 2022-01-23
  Administered 2022-01-23: 15 ug via INTRATHECAL

## 2022-01-23 MED ORDER — SODIUM CHLORIDE 0.9 % IV SOLN
5.0000 10*6.[IU] | Freq: Once | INTRAVENOUS | Status: AC
  Administered 2022-01-23: 5 10*6.[IU] via INTRAVENOUS
  Filled 2022-01-23: qty 5

## 2022-01-23 MED ORDER — KETOROLAC TROMETHAMINE 30 MG/ML IJ SOLN
30.0000 mg | Freq: Once | INTRAMUSCULAR | Status: AC
  Administered 2022-01-23: 30 mg via INTRAVENOUS

## 2022-01-23 MED ORDER — ACETAMINOPHEN 10 MG/ML IV SOLN: 1000.0000 mg | Freq: Once | INTRAVENOUS | Status: DC | PRN

## 2022-01-23 MED ORDER — NALOXONE HCL 4 MG/10ML IJ SOLN: 1.0000 ug/kg/h | INTRAVENOUS | Status: DC | PRN

## 2022-01-23 MED ORDER — PROMETHAZINE HCL 25 MG/ML IJ SOLN: 6.2500 mg | INTRAMUSCULAR | Status: DC | PRN

## 2022-01-23 MED ORDER — OXYCODONE HCL 5 MG PO TABS: 5.0000 mg | ORAL_TABLET | Freq: Once | ORAL | Status: DC | PRN

## 2022-01-23 MED ORDER — FAMOTIDINE IN NACL 20-0.9 MG/50ML-% IV SOLN
20.0000 mg | Freq: Once | INTRAVENOUS | Status: AC
  Administered 2022-01-23: 20 mg via INTRAVENOUS
  Filled 2022-01-23: qty 50

## 2022-01-23 MED ORDER — DEXMEDETOMIDINE HCL IN NACL 80 MCG/20ML IV SOLN
INTRAVENOUS | Status: AC
  Filled 2022-01-23: qty 20

## 2022-01-23 MED ORDER — SOD CITRATE-CITRIC ACID 500-334 MG/5ML PO SOLN
30.0000 mL | ORAL | Status: DC | PRN
Start: 2022-01-23 — End: 2022-01-23

## 2022-01-23 MED ORDER — WITCH HAZEL-GLYCERIN EX PADS: 1.0000 | MEDICATED_PAD | CUTANEOUS | Status: DC | PRN

## 2022-01-23 MED ORDER — KETOROLAC TROMETHAMINE 30 MG/ML IJ SOLN
INTRAMUSCULAR | Status: AC
  Filled 2022-01-23: qty 1

## 2022-01-23 MED ORDER — LACTATED RINGERS IV BOLUS (SEPSIS)
1000.0000 mL | Freq: Once | INTRAVENOUS | Status: AC
  Administered 2022-01-23: 1000 mL via INTRAVENOUS

## 2022-01-23 MED ORDER — MEDROXYPROGESTERONE ACETATE 150 MG/ML IM SUSP: 150.0000 mg | INTRAMUSCULAR | Status: DC | PRN

## 2022-01-23 MED ORDER — CEFAZOLIN SODIUM-DEXTROSE 2-4 GM/100ML-% IV SOLN
2.0000 g | Freq: Once | INTRAVENOUS | Status: AC
  Administered 2022-01-23: 2 g via INTRAVENOUS

## 2022-01-23 MED ORDER — OXYTOCIN-SODIUM CHLORIDE 30-0.9 UT/500ML-% IV SOLN
2.5000 [IU]/h | INTRAVENOUS | Status: AC
  Administered 2022-01-23: 2.5 [IU]/h via INTRAVENOUS
  Filled 2022-01-23: qty 500

## 2022-01-23 MED ORDER — KETOROLAC TROMETHAMINE 30 MG/ML IJ SOLN
30.0000 mg | Freq: Four times a day (QID) | INTRAMUSCULAR | Status: AC
  Administered 2022-01-23 – 2022-01-24 (×2): 30 mg via INTRAVENOUS
  Filled 2022-01-23 (×3): qty 1

## 2022-01-23 MED ORDER — OXYCODONE HCL 5 MG PO TABS
5.0000 mg | ORAL_TABLET | ORAL | Status: DC | PRN
  Administered 2022-01-24: 5 mg via ORAL
  Administered 2022-01-25: 10 mg via ORAL
  Filled 2022-01-23: qty 2
  Filled 2022-01-23: qty 1

## 2022-01-23 MED ORDER — LACTATED RINGERS IV BOLUS (SEPSIS): 600.0000 mL | Freq: Once | INTRAVENOUS | Status: AC

## 2022-01-23 MED ORDER — MENTHOL 3 MG MT LOZG: 1.0000 | LOZENGE | OROMUCOSAL | Status: DC | PRN

## 2022-01-23 MED ORDER — IBUPROFEN 600 MG PO TABS
600.0000 mg | ORAL_TABLET | Freq: Four times a day (QID) | ORAL | Status: DC
  Administered 2022-01-24 – 2022-01-26 (×9): 600 mg via ORAL
  Filled 2022-01-23 (×9): qty 1

## 2022-01-23 MED ORDER — ONDANSETRON HCL 4 MG/2ML IJ SOLN: 4.0000 mg | Freq: Three times a day (TID) | INTRAMUSCULAR | Status: DC | PRN

## 2022-01-23 MED ORDER — DEXMEDETOMIDINE (PRECEDEX) IN NS 20 MCG/5ML (4 MCG/ML) IV SYRINGE
PREFILLED_SYRINGE | INTRAVENOUS | Status: DC | PRN
  Administered 2022-01-23: 4 ug via INTRAVENOUS
  Administered 2022-01-23: 8 ug via INTRAVENOUS
  Administered 2022-01-23: 4 ug via INTRAVENOUS

## 2022-01-23 MED ORDER — SODIUM CHLORIDE 0.9 % IV SOLN
500.0000 mg | INTRAVENOUS | Status: DC
  Filled 2022-01-23: qty 25

## 2022-01-23 MED ORDER — NALOXONE HCL 0.4 MG/ML IJ SOLN: 0.4000 mg | INTRAMUSCULAR | Status: DC | PRN

## 2022-01-23 MED ORDER — SIMETHICONE 80 MG PO CHEW: 80.0000 mg | CHEWABLE_TABLET | ORAL | Status: DC | PRN

## 2022-01-23 MED ORDER — FENTANYL CITRATE (PF) 100 MCG/2ML IJ SOLN
INTRAMUSCULAR | Status: AC
  Filled 2022-01-23: qty 2

## 2022-01-23 MED ORDER — TERBUTALINE SULFATE 1 MG/ML IJ SOLN
0.2500 mg | Freq: Once | INTRAMUSCULAR | Status: DC | PRN
Start: 2022-01-23 — End: 2022-01-23

## 2022-01-23 MED ORDER — ACETAMINOPHEN 10 MG/ML IV SOLN
INTRAVENOUS | Status: AC
  Filled 2022-01-23: qty 100

## 2022-01-23 MED ORDER — LACTATED RINGERS IV SOLN: INTRAVENOUS | Status: DC

## 2022-01-23 MED ORDER — SCOPOLAMINE 1 MG/3DAYS TD PT72
1.0000 | MEDICATED_PATCH | Freq: Once | TRANSDERMAL | Status: AC
  Administered 2022-01-23: 1.5 mg via TRANSDERMAL

## 2022-01-23 MED ORDER — SODIUM CHLORIDE 0.9% FLUSH: 3.0000 mL | INTRAVENOUS | Status: DC | PRN

## 2022-01-23 MED ORDER — PHENYLEPHRINE HCL-NACL 20-0.9 MG/250ML-% IV SOLN
INTRAVENOUS | Status: AC
  Filled 2022-01-23: qty 250

## 2022-01-23 MED ORDER — OXYTOCIN-SODIUM CHLORIDE 30-0.9 UT/500ML-% IV SOLN: 2.5000 [IU]/h | INTRAVENOUS | Status: DC

## 2022-01-23 MED ORDER — ACETAMINOPHEN 10 MG/ML IV SOLN
INTRAVENOUS | Status: DC | PRN
  Administered 2022-01-23: 1000 mg via INTRAVENOUS

## 2022-01-23 MED ORDER — MORPHINE SULFATE (PF) 0.5 MG/ML IJ SOLN
INTRAMUSCULAR | Status: DC | PRN
  Administered 2022-01-23: 150 ug via INTRATHECAL

## 2022-01-23 MED ORDER — DIBUCAINE (PERIANAL) 1 % EX OINT: 1.0000 | TOPICAL_OINTMENT | CUTANEOUS | Status: DC | PRN

## 2022-01-23 MED ORDER — METOCLOPRAMIDE HCL 5 MG/ML IJ SOLN
INTRAMUSCULAR | Status: DC | PRN
  Administered 2022-01-23: 10 mg via INTRAVENOUS

## 2022-01-23 MED ORDER — BUPIVACAINE IN DEXTROSE 0.75-8.25 % IT SOLN
INTRATHECAL | Status: DC | PRN
  Administered 2022-01-23: 1.6 mL via INTRATHECAL

## 2022-01-23 MED ORDER — COCONUT OIL OIL: 1.0000 | TOPICAL_OIL | Status: DC | PRN

## 2022-01-23 MED ORDER — LIDOCAINE HCL (PF) 1 % IJ SOLN
30.0000 mL | INTRAMUSCULAR | Status: DC | PRN
Start: 2022-01-23 — End: 2022-01-23

## 2022-01-23 MED ORDER — FENTANYL CITRATE (PF) 100 MCG/2ML IJ SOLN
25.0000 ug | INTRAMUSCULAR | Status: DC | PRN
  Administered 2022-01-23: 25 ug via INTRAVENOUS

## 2022-01-23 MED ORDER — GABAPENTIN 100 MG PO CAPS
200.0000 mg | ORAL_CAPSULE | Freq: Every day | ORAL | Status: DC
  Administered 2022-01-23 – 2022-01-25 (×3): 200 mg via ORAL
  Filled 2022-01-23 (×3): qty 2

## 2022-01-23 MED ORDER — LIDOCAINE HCL (PF) 1 % IJ SOLN
INTRAMUSCULAR | Status: AC
  Filled 2022-01-23: qty 5

## 2022-01-23 MED ORDER — SCOPOLAMINE 1 MG/3DAYS TD PT72
MEDICATED_PATCH | TRANSDERMAL | Status: DC | PRN
  Administered 2022-01-23: 1 via TRANSDERMAL

## 2022-01-23 MED ORDER — PRENATAL MULTIVITAMIN CH
1.0000 | ORAL_TABLET | Freq: Every day | ORAL | Status: DC
  Administered 2022-01-24 – 2022-01-26 (×3): 1 via ORAL
  Filled 2022-01-23 (×3): qty 1

## 2022-01-23 MED ORDER — ONDANSETRON HCL 4 MG/2ML IJ SOLN
INTRAMUSCULAR | Status: DC | PRN
  Administered 2022-01-23: 4 mg via INTRAVENOUS

## 2022-01-23 MED ORDER — ZOLPIDEM TARTRATE 5 MG PO TABS: 5.0000 mg | ORAL_TABLET | Freq: Every evening | ORAL | Status: DC | PRN

## 2022-01-23 MED ORDER — SIMETHICONE 80 MG PO CHEW
80.0000 mg | CHEWABLE_TABLET | Freq: Three times a day (TID) | ORAL | Status: DC
  Administered 2022-01-23 – 2022-01-26 (×5): 80 mg via ORAL
  Filled 2022-01-23 (×7): qty 1

## 2022-01-23 MED ORDER — ACETAMINOPHEN 500 MG PO TABS
1000.0000 mg | ORAL_TABLET | Freq: Four times a day (QID) | ORAL | Status: DC
  Administered 2022-01-23 – 2022-01-26 (×11): 1000 mg via ORAL
  Filled 2022-01-23 (×11): qty 2

## 2022-01-23 MED ORDER — AMISULPRIDE (ANTIEMETIC) 5 MG/2ML IV SOLN: 10.0000 mg | Freq: Once | INTRAVENOUS | Status: DC | PRN

## 2022-01-23 MED ORDER — FENTANYL CITRATE (PF) 100 MCG/2ML IJ SOLN
50.0000 ug | INTRAMUSCULAR | Status: DC | PRN
  Filled 2022-01-23: qty 2

## 2022-01-23 MED ORDER — ALBUMIN HUMAN 5 % IV SOLN
12.5000 g | Freq: Once | INTRAVENOUS | Status: AC
  Administered 2022-01-23: 12.5 g via INTRAVENOUS
  Filled 2022-01-23: qty 250

## 2022-01-23 MED ORDER — DIPHENHYDRAMINE HCL 50 MG/ML IJ SOLN: 12.5000 mg | Freq: Four times a day (QID) | INTRAMUSCULAR | Status: DC | PRN

## 2022-01-23 MED ORDER — DIPHENHYDRAMINE HCL 25 MG PO CAPS: 25.0000 mg | ORAL_CAPSULE | ORAL | Status: DC | PRN

## 2022-01-23 MED ORDER — SCOPOLAMINE 1 MG/3DAYS TD PT72
MEDICATED_PATCH | TRANSDERMAL | Status: AC
  Filled 2022-01-23: qty 1

## 2022-01-23 MED ORDER — PHENYLEPHRINE 80 MCG/ML (10ML) SYRINGE FOR IV PUSH (FOR BLOOD PRESSURE SUPPORT)
PREFILLED_SYRINGE | INTRAVENOUS | Status: AC
  Filled 2022-01-23: qty 10

## 2022-01-23 MED ORDER — OXYTOCIN BOLUS FROM INFUSION: 333.0000 mL | Freq: Once | INTRAVENOUS | Status: DC

## 2022-01-23 MED ORDER — ONDANSETRON HCL 4 MG/2ML IJ SOLN
INTRAMUSCULAR | Status: AC
  Filled 2022-01-23: qty 2

## 2022-01-23 MED ORDER — ONDANSETRON HCL 4 MG/2ML IJ SOLN: 4.0000 mg | Freq: Four times a day (QID) | INTRAMUSCULAR | Status: DC | PRN

## 2022-01-23 MED ORDER — ALBUMIN HUMAN 5 % IV SOLN
12.5000 g | Freq: Once | INTRAVENOUS | Status: AC
Start: 2022-01-23 — End: 2022-01-23
  Administered 2022-01-23: 12.5 g via INTRAVENOUS
  Filled 2022-01-23: qty 250

## 2022-01-23 MED ORDER — PENICILLIN G POT IN DEXTROSE 60000 UNIT/ML IV SOLN: 3.0000 10*6.[IU] | INTRAVENOUS | Status: DC

## 2022-01-23 MED ORDER — ENOXAPARIN SODIUM 40 MG/0.4ML IJ SOSY
40.0000 mg | PREFILLED_SYRINGE | INTRAMUSCULAR | Status: DC
  Administered 2022-01-24 – 2022-01-25 (×2): 40 mg via SUBCUTANEOUS
  Filled 2022-01-23 (×3): qty 0.4

## 2022-01-23 MED ORDER — LACTATED RINGERS IV SOLN
500.0000 mL | INTRAVENOUS | Status: DC | PRN
  Administered 2022-01-23: 1000 mL via INTRAVENOUS

## 2022-01-23 MED ORDER — OXYTOCIN-SODIUM CHLORIDE 30-0.9 UT/500ML-% IV SOLN
INTRAVENOUS | Status: DC | PRN
  Administered 2022-01-23: 250 mL via INTRAVENOUS

## 2022-01-23 MED ORDER — METOCLOPRAMIDE HCL 5 MG/ML IJ SOLN
INTRAMUSCULAR | Status: AC
  Filled 2022-01-23: qty 2

## 2022-01-23 MED ORDER — CEFAZOLIN SODIUM-DEXTROSE 2-4 GM/100ML-% IV SOLN
INTRAVENOUS | Status: AC
  Filled 2022-01-23: qty 100

## 2022-01-23 MED ORDER — OXYTOCIN-SODIUM CHLORIDE 30-0.9 UT/500ML-% IV SOLN
INTRAVENOUS | Status: AC
  Filled 2022-01-23: qty 500

## 2022-01-23 SURGICAL SUPPLY — 36 items
BENZOIN TINCTURE PRP APPL 2/3 (GAUZE/BANDAGES/DRESSINGS) IMPLANT
CHLORAPREP W/TINT 26ML (MISCELLANEOUS) ×2 IMPLANT
CLAMP CORD UMBIL (MISCELLANEOUS) ×1 IMPLANT
CLOTH BEACON ORANGE TIMEOUT ST (SAFETY) ×1 IMPLANT
DERMABOND ADHESIVE PROPEN (GAUZE/BANDAGES/DRESSINGS) ×1
DERMABOND ADVANCED (GAUZE/BANDAGES/DRESSINGS) ×2
DERMABOND ADVANCED .7 DNX12 (GAUZE/BANDAGES/DRESSINGS) ×2 IMPLANT
DERMABOND ADVANCED .7 DNX6 (GAUZE/BANDAGES/DRESSINGS) IMPLANT
DRSG OPSITE POSTOP 4X10 (GAUZE/BANDAGES/DRESSINGS) ×1 IMPLANT
ELECT REM PT RETURN 9FT ADLT (ELECTROSURGICAL) ×1
ELECTRODE REM PT RTRN 9FT ADLT (ELECTROSURGICAL) ×1 IMPLANT
EXTRACTOR VACUUM M CUP 4 TUBE (SUCTIONS) IMPLANT
GLOVE BIOGEL PI IND STRL 7.0 (GLOVE) ×2 IMPLANT
GLOVE BIOGEL PI IND STRL 7.5 (GLOVE) ×2 IMPLANT
GLOVE BIOGEL PI INDICATOR 7.0 (GLOVE) ×2
GLOVE BIOGEL PI INDICATOR 7.5 (GLOVE) ×2
GLOVE ECLIPSE 7.5 STRL STRAW (GLOVE) ×1 IMPLANT
GOWN STRL REUS W/TWL LRG LVL3 (GOWN DISPOSABLE) ×3 IMPLANT
KIT ABG SYR 3ML LUER SLIP (SYRINGE) IMPLANT
NDL HYPO 25X5/8 SAFETYGLIDE (NEEDLE) IMPLANT
NEEDLE HYPO 25X5/8 SAFETYGLIDE (NEEDLE) IMPLANT
NS IRRIG 1000ML POUR BTL (IV SOLUTION) ×1 IMPLANT
PACK C SECTION WH (CUSTOM PROCEDURE TRAY) ×1 IMPLANT
PAD OB MATERNITY 4.3X12.25 (PERSONAL CARE ITEMS) ×1 IMPLANT
RTRCTR C-SECT PINK 25CM LRG (MISCELLANEOUS) ×1 IMPLANT
STRIP CLOSURE SKIN 1/2X4 (GAUZE/BANDAGES/DRESSINGS) IMPLANT
SUT MNCRL 0 VIOLET CTX 36 (SUTURE) ×2 IMPLANT
SUT MONOCRYL 0 CTX 36 (SUTURE) ×2
SUT VIC AB 0 CTX 36 (SUTURE) ×1
SUT VIC AB 0 CTX36XBRD ANBCTRL (SUTURE) ×1 IMPLANT
SUT VIC AB 2-0 CT1 27 (SUTURE) ×1
SUT VIC AB 2-0 CT1 TAPERPNT 27 (SUTURE) ×1 IMPLANT
SUT VIC AB 4-0 KS 27 (SUTURE) ×1 IMPLANT
TOWEL OR 17X24 6PK STRL BLUE (TOWEL DISPOSABLE) ×1 IMPLANT
TRAY FOLEY W/BAG SLVR 14FR LF (SET/KITS/TRAYS/PACK) ×1 IMPLANT
WATER STERILE IRR 1000ML POUR (IV SOLUTION) ×1 IMPLANT

## 2022-01-23 NOTE — Op Note (Signed)
Mindy Key PROCEDURE DATE: 01/23/2022  PREOPERATIVE DIAGNOSES: Intrauterine pregnancy at [redacted]w[redacted]d weeks gestation;  elective repeat  POSTOPERATIVE DIAGNOSES: The same, viable infant delivered  PROCEDURE: Repeat Low Transverse Cesarean Section  SURGEON:  Dr. Candelaria Celeste  ASSISTANT:  Myrtie Hawk, DO An experienced assistant was required given the standard of surgical care given the complexity of the case.  This assistant was needed for exposure, dissection, suctioning, retraction, instrument exchange, assisting with delivery with administration of fundal pressure, and for overall help during the procedure.  ANESTHESIOLOGY TEAM: Anesthesiologist: Leonides Grills, MD CRNA: Graciela Husbands, CRNA  INDICATIONS: Mindy Key is a 21 y.o. [phone] at [redacted]w[redacted]d here for cesarean section secondary to the indications listed under preoperative diagnoses; please see preoperative note for further details.  The risks of surgery were discussed with the patient including but were not limited to: bleeding which may require transfusion or reoperation; infection which may require antibiotics; injury to bowel, bladder, ureters or other surrounding organs; injury to the fetus; need for additional procedures including hysterectomy in the event of a life-threatening hemorrhage; formation of adhesions; placental abnormalities wth subsequent pregnancies; incisional problems; thromboembolic phenomenon and other postoperative/anesthesia complications.  The patient concurred with the proposed plan, giving informed written consent for the procedure.    FINDINGS:  Viable female infant in cephalic presentation.  Apgars 9 and 9.  Amniotic fluid: clear.  Intact placenta, three vessel cord.  Normal uterus, fallopian tubes and ovaries bilaterally.  ANESTHESIA: spinal INTRAVENOUS FLUIDS: 2250 ml   ESTIMATED BLOOD LOSS: 439 ml URINE OUTPUT:  200 ml SPECIMENS: Placenta sent to L&D . COMPLICATIONS: None  immediate  PROCEDURE IN DETAIL:  The patient preoperatively received intravenous antibiotics and had sequential compression devices applied to her lower extremities.  She was then taken to the operating room where spinal anesthesia was found to be adequate. She was then placed in a dorsal supine position with a leftward tilt, and prepped and draped in a sterile manner.  A foley catheter was  placed into her bladder and attached to constant gravity.  After an adequate timeout was performed, a Pfannenstiel skin incision was made with scalpel and carried through to the underlying layer of fascia. The fascia was incised in the midline, and this incision was extended sharply with mayo scissors. The rectus muscles were separated in the midline and the peritoneum was entered bluntly.   The Alexis self-retaining retractor was introduced into the abdominal cavity.  Attention was turned to the lower uterine segment where a low transverse hysterotomy was made with a scalpel and extended bluntly in caudad and cephalad directions.  The infant was successfully delivered, the cord was clamped and cut after one minute, and the infant was handed over to the awaiting neonatology team. Uterine massage was then administered, and the placenta delivered intact with a three-vessel cord. The uterus was then cleared of clots and debris.  The hysterotomy was closed with 0-Monocryl in a running fashion.    The pelvis was cleared of all clot and debris. Hemostasis was confirmed on all surfaces.  The uterus incision was once again inspected and found to be hemostatic. The retractor was removed.  The peritoneum was closed with a 2-0 Vicryl running stitch. The fascia was then closed using 0 Vicryl in a running fashion.  The subcutaneous layer was irrigated, any areas of bleeding were cauterized with the bovie, was reapproximated with 2-0 plain gut in a running fashion,  and was found to be hemostatic. The skin was  closed with a 4-0 Vicryl  subcuticular stitch. The patient tolerated the procedure well. Sponge, instrument and needle counts were correct x 3.  She was taken to the recovery room in stable condition.    Myrtie Hawk, DO FMOB Fellow, Faculty practice [facility], [facility] 01/23/22  3:35 PM

## 2022-01-23 NOTE — Anesthesia Postprocedure Evaluation (Signed)
Anesthesia Post Note  Patient: [name]  Procedure(s) Performed: CESAREAN SECTION     Patient location during evaluation: PACU Anesthesia Type: Spinal Level of consciousness: awake and alert Pain management: pain level controlled Vital Signs Assessment: post-procedure vital signs reviewed and stable Respiratory status: spontaneous breathing, nonlabored ventilation and respiratory function stable Cardiovascular status: blood pressure returned to baseline Postop Assessment: no apparent nausea or vomiting, spinal receding, no headache and no backache Anesthetic complications: no   No notable events documented.  Last Vitals:  Vitals:   01/23/22 1811 01/23/22 1815  BP:  112/70  Pulse:  81  Resp:  14  Temp: (!) 36.3 C   SpO2:  99%    Last Pain:  Vitals:   01/23/22 1815  TempSrc:   PainSc: 0-No pain   Pain Goal:    LLE Motor Response: Purposeful movement (01/23/22 1815) LLE Sensation: Numbness, Tingling (01/23/22 1815) RLE Motor Response: Purposeful movement (01/23/22 1815) RLE Sensation: Numbness, Tingling (01/23/22 1815)     Epidural/Spinal Function Cutaneous sensation: Tingles (01/23/22 1815), Patient able to flex knees: Yes (01/23/22 1815), Patient able to lift hips off bed: Yes (01/23/22 1815), Back pain beyond tenderness at insertion site: No (01/23/22 1815), Progressively worsening motor and/or sensory loss: No (01/23/22 1815), Bowel and/or bladder incontinence post epidural: No (01/23/22 1815)  Mindy Key

## 2022-01-23 NOTE — Lactation Note (Signed)
This note was copied from a baby's chart. Lactation Consultation Note  Patient Name: Mindy Key SHUOH'F Date: 01/23/2022 Reason for consult: Initial assessment;Term;Breastfeeding assistance Age:23 hours  P2, Term, Infant Female  Infant at 7 hours.  LC entered the room and baby asleep next to the birth parent.  Birth parent states that she breast fed her oldest child for 1 month.  She says that she has a hands-free pump with her.  The birth parent does not currently have any concerns at this time.  LC left outpatient services brochure with the parent.   Current Feeding Plan:  Breastfeed 8+ times in 24 hours according to feeding cues.  Hand express and spoon feed breast milk to baby via a spoon.  Call RN/LC for assistance with breastfeeding.  Maternal Data Does the patient have breastfeeding experience prior to this delivery?: Yes How long did the patient breastfeed?: 1 month  Feeding Mother's Current Feeding Choice: Breast Milk  LATCH Score                    Lactation Tools Discussed/Used    Interventions Interventions: LC Services brochure  Discharge Pump: Hands Free;Personal  Consult Status Consult Status: Follow-up Date: 01/24/22 Follow-up type: In-patient    Mindy Key P Mindy Key 01/23/2022, 10:00 PM

## 2022-01-23 NOTE — Discharge Summary (Signed)
Postpartum Discharge Summary  Date of Service updated 01/26/22    Patient Name: Mindy Key DOB: 2000-12-18 MRN: [id number]  Date of admission: 01/23/2022 Delivery date:01/23/2022  Delivering provider: Truett Mainland  Date of discharge: 01/26/2022  Admitting diagnosis: GDM (gestational diabetes mellitus) [O24.419] Status post cesarean section [Z98.891] Intrauterine pregnancy: [redacted]w[redacted]d    Secondary diagnosis:  Principal Problem:   GDM (gestational diabetes mellitus) Active Problems:   Status post cesarean section  Additional problems: Hypothermic sepsis workup.    Discharge diagnosis: Term Pregnancy Delivered                                              Post partum procedures: IV IRON Augmentation: N/A Complications: None  Hospital course: Induction of Labor With Cesarean Section   21y.o. yo [id number] [id number] admitted to the hospital 01/23/2022 for induction of labor. Patient had a labor course significant for not wanting to labor, preferring a repeat c/s. The patient went for cesarean section due to Elective Repeat. Delivery details are as follows: Membrane Rupture Time/Date: 2:40 PM ,01/23/2022   Delivery Method:C-Section, Low Transverse  Details of operation can be found in separate operative Note.  Patient had an complicated postpartum course by hypothermia prompting a sepsis w/up. Bcx negative at 3 days. Lactic acid negative. She is ambulating, tolerating a regular diet, passing flatus, and urinating well.  Patient is discharged home in stable condition on 01/26/22.      Newborn Data: Birth date:01/23/2022  Birth time:2:41 PM  Gender:Female  Living status:Living  Apgars:9 ,9  Weight:2880 g                                Magnesium Sulfate received: No BMZ received: No Rhophylac:N/A MMR:N/A T-DaP:Given prenatally Flu: N/A Transfusion:No  Physical exam  Vitals:   01/24/22 2144 01/25/22 0559 01/25/22 2300 01/26/22 0617  BP: (!) 104/53 105/74 (!) 116/56 (!) 121/56   Pulse: 92 62 95 77  Resp: '18 18 18 16  ' Temp: 98.6 F (37 C) 98.2 F (36.8 C) 98.1 F (36.7 C) 98.8 F (37.1 C)  TempSrc: Oral Oral Oral Oral  SpO2:  98% 100% 100%  Weight:      Height:       General: alert, cooperative, and no distress Lochia: appropriate Uterine Fundus: firm Incision: Healing well with no significant drainage, No significant erythema, Dressing is clean, dry, and intact DVT Evaluation: No evidence of DVT seen on physical exam. Labs: Lab Results  Component Value Date   WBC 18.7 (H) 01/24/2022   HGB 8.2 (L) 01/24/2022   HCT 27.1 (L) 01/24/2022   MCV 77.0 (L) 01/24/2022   PLT 151 01/24/2022      Latest Ref Rng & Units 01/23/2022    4:41 PM  CMP  Glucose 70 - 99 mg/dL 87   BUN 6 - 20 mg/dL <5   Creatinine 0.44 - 1.00 mg/dL 0.63   Sodium 135 - 145 mmol/L 134   Potassium 3.5 - 5.1 mmol/L 4.0   Chloride 98 - 111 mmol/L 109   CO2 22 - 32 mmol/L 20   Calcium 8.9 - 10.3 mg/dL 8.6   Total Protein 6.5 - 8.1 g/dL 5.9   Total Bilirubin 0.3 - 1.2 mg/dL 0.5   Alkaline Phos 38 - 126 U/L 136  AST 15 - 41 U/L 16   ALT 0 - 44 U/L 11    Edinburgh Score:    01/23/2022   10:54 PM  Edinburgh Postnatal Depression Scale Screening Tool  I have been able to laugh and see the funny side of things. 0  I have looked forward with enjoyment to things. 0  I have blamed myself unnecessarily when things went wrong. 0  I have been anxious or worried for no good reason. 0  I have felt scared or panicky for no good reason. 0  Things have been getting on top of me. 0  I have been so unhappy that I have had difficulty sleeping. 0  I have felt sad or miserable. 2  I have been so unhappy that I have been crying. 1  The thought of harming myself has occurred to me. 0  Edinburgh Postnatal Depression Scale Total 3     After visit meds:  Allergies as of 01/26/2022   No Known Allergies      Medication List     TAKE these medications    acetaminophen 500 MG tablet Commonly  known as: TYLENOL Take 2 tablets (1,000 mg total) by mouth every 6 (six) hours.   docusate sodium 250 MG capsule Commonly known as: COLACE Take 1 capsule (250 mg total) by mouth 2 (two) times daily.   famotidine 10 MG tablet Commonly known as: PEPCID Take 1 tablet (10 mg total) by mouth 2 (two) times daily as needed for heartburn or indigestion.   ferrous sulfate 325 (65 FE) MG EC tablet Take 1 tablet (325 mg total) by mouth every other day.   ibuprofen 600 MG tablet Commonly known as: ADVIL Take 1 tablet (600 mg total) by mouth every 6 (six) hours.   miconazole 2 % vaginal cream Commonly known as: MONISTAT 7 Place 1 Applicatorful vaginally at bedtime. Apply for seven nights   multivitamin-prenatal 27-0.8 MG Tabs tablet Take 1 tablet by mouth daily at 12 noon.   ondansetron 4 MG tablet Commonly known as: Zofran Take 1 tablet (4 mg total) by mouth every 8 (eight) hours as needed for nausea or vomiting.   oxyCODONE 5 MG immediate release tablet Commonly known as: Oxy IR/ROXICODONE Take 1-2 tablets (5-10 mg total) by mouth every 4 (four) hours as needed for moderate pain.   senna-docusate 8.6-50 MG tablet Commonly known as: Senokot-S Take 2 tablets by mouth daily.         Discharge home in stable condition Infant Feeding: Bottle and Breast Infant Disposition:home with mother Discharge instruction: per After Visit Summary and Postpartum booklet. Activity: Advance as tolerated. Pelvic rest for 6 weeks.  Diet: routine diet Future Appointments: Future Appointments  Date Time Provider Robert Lee  02/01/2022 10:00 AM [facility] NURSE [facility]  03/05/2022  3:55 PM Chancy Milroy, MD [facility]   Follow up Visit:  Message sent to [facility] on 01/26/22--[name] Q Mercado-Ortiz, DO  Please schedule this patient for a In person postpartum visit in 6 weeks with the following provider: Any provider. Additional Postpartum F/U:Incision check 1 week  High risk pregnancy  complicated by:  M3OTR Delivery mode:  C-Section, Low Transverse  Anticipated Birth Control:  Depo   01/26/2022 [name], DO

## 2022-01-23 NOTE — Anesthesia Preprocedure Evaluation (Signed)
Anesthesia Evaluation    Reviewed: Allergy & Precautions, Patient's Chart, lab work & pertinent test results  Airway Mallampati: II  TM Distance: >3 FB Neck ROM: Full    Dental no notable dental hx.    Pulmonary former smoker,    Pulmonary exam normal        Cardiovascular negative cardio ROS Normal cardiovascular exam     Neuro/Psych negative neurological ROS     GI/Hepatic negative GI ROS, Neg liver ROS,   Endo/Other  negative endocrine ROS  Renal/GU negative Renal ROS     Musculoskeletal negative musculoskeletal ROS (+)   Abdominal (+) + obese,   Peds  Hematology  (+) Blood dyscrasia, anemia ,   Anesthesia Other Findings  Elective Repeat C-S  Reproductive/Obstetrics (+) Pregnancy                             Anesthesia Physical Anesthesia Plan  ASA: 2  Anesthesia Plan: Spinal   Post-op Pain Management:    Induction: Intravenous  PONV Risk Score and Plan: 2 and Ondansetron, Dexamethasone and Treatment may vary due to age or medical condition  Airway Management Planned: Natural Airway  Additional Equipment:   Intra-op Plan:   Post-operative Plan:   Informed Consent: I have reviewed the patients History and Physical, chart, labs and discussed the procedure including the risks, benefits and alternatives for the proposed anesthesia with the patient or authorized representative who has indicated his/her understanding and acceptance.     Dental advisory given  Plan Discussed with: CRNA  Anesthesia Plan Comments:         Anesthesia Quick Evaluation

## 2022-01-23 NOTE — H&P (Signed)
OBSTETRIC ADMISSION HISTORY AND PHYSICAL  Mindy Key is a 21 y.o. female G3P1011 with IUP at [redacted]w[redacted]d by early ultrasound presenting for IOL for A1GMD. She reports +FMs, No LOF, no VB, no blurry vision, headaches or peripheral edema, and RUQ pain.  She plans on breast feeding. She request natural family planning for birth control. She received her prenatal care at [facility]   Dating: By first trimester ultrasound--->  Estimated Date of Delivery: 01/30/22   Nursing Staff Provider  Office Location  [facility] Dating   U/S  [facility] Model [x ] Traditional [ ]  Centering [ ]  Mom-Baby Dyad    Language  English Anatomy  WNL  Flu Vaccine  07/06/21 Genetic/Carrier Screen  NIPS:  LR female   AFP:   negative Horizon: negative  TDaP Vaccine   10/30/21 Hgb A1C or  GTT Early 5.5 Third trimester   COVID Vaccine No   LAB RESULTS   Rhogam  N/A Blood Type   O positive  Baby Feeding Plan Breast Antibody  positive, Anti Lewis  Contraception Pills vs Depo Rubella  immune  Circumcision Yes RPR   negative  Pediatrician  LIST GIVEN HBsAg   negative  Support Person Jarrett(FOB) HCVAb  negative  Prenatal Classes  HIV     negative  BTL Consent NA GBS   + urine, Tx in labor  VBAC Consent NA Pap  2/23 ASCUS HPV +, repeat PP       DME Rx 12/30/21 ] BP cuff [ ]  Weight Scale Waterbirth  [ ]  Class [ ]  Consent [ ]  CNM visit  PHQ9 & GAD7 [  ] new OB [  ] 28 weeks  [  ] 36 weeks Induction  [ ]  Orders Entered [ ] Foley Y/N   Prenatal History/Complications: A1GDM, TOLAC for breech, anemia  Past Medical History: Past Medical History:  Diagnosis Date   Anemia     Past Surgical History: Past Surgical History:  Procedure Laterality Date   CESAREAN SECTION      Obstetrical History: OB History     Gravida  3   Para  1   Term  1   Preterm      AB  1   Living  1      SAB  1   IAB      Ectopic      Multiple      Live Births  1           Social History Social History   Socioeconomic History    Marital status: Significant Other    Spouse name: Not on file   Number of children: Not on file   Years of education: Not on file   Highest education level: Not on file  Occupational History   Not on file  Tobacco Use   Smoking status: Former    Types: Cigarettes    Quit date: 04/05/2021    Years since quitting: 0.8   Smokeless tobacco: Never  Vaping Use   Vaping Use: Never used  Substance and Sexual Activity   Alcohol use: Not Currently   Drug use: Never   Sexual activity: Yes    Birth control/protection: None  Other Topics Concern   Not on file  Social History Narrative   Not on file   Social Determinants of Health   Financial Resource Strain: Not on file  Food Insecurity: Not on file  Transportation Needs: Not on file  Physical Activity: Not on file  Stress: Not  on file  Social Connections: Not on file    Family History: Family History  Problem Relation Age of Onset   Healthy Mother    Healthy Father    Cancer Maternal Uncle     Allergies: No Known Allergies  Medications Prior to Admission  Medication Sig Dispense Refill Last Dose   docusate sodium (COLACE) 250 MG capsule Take 1 capsule (250 mg total) by mouth 2 (two) times daily. (Patient not taking: Reported on 07/06/2021) 30 capsule 0    famotidine (PEPCID) 10 MG tablet Take 1 tablet (10 mg total) by mouth 2 (two) times daily as needed for heartburn or indigestion. (Patient not taking: Reported on 01/22/2022) 30 tablet 1    ferrous sulfate 325 (65 FE) MG EC tablet Take 1 tablet (325 mg total) by mouth every other day. (Patient not taking: Reported on 09/28/2021) 30 tablet 3    miconazole (MONISTAT 7) 2 % vaginal cream Place 1 Applicatorful vaginally at bedtime. Apply for seven nights (Patient not taking: Reported on 01/22/2022) 30 g 2    ondansetron (ZOFRAN) 4 MG tablet Take 1 tablet (4 mg total) by mouth every 8 (eight) hours as needed for nausea or vomiting. (Patient not taking: Reported on 01/22/2022) 20 tablet 0     Prenatal Vit-Fe Fumarate-FA (MULTIVITAMIN-PRENATAL) 27-0.8 MG TABS tablet Take 1 tablet by mouth daily at 12 noon.      Review of Systems   All systems reviewed and negative except as stated in HPI  Blood pressure 133/75, pulse 69, temperature 98.3 F (36.8 C), temperature source Oral, resp. rate 18, height 5\' 3"  (1.6 m), weight 90.6 kg, last menstrual period 04/24/2021. General appearance: alert, cooperative, and no distress Lungs: clear to auscultation bilaterally Heart: regular rate and rhythm Abdomen: soft, non-tender; bowel sounds normal Pelvic: n/a Extremities: Homans sign is negative, no sign of DVT DTR's +2 Presentation: cephalic Fetal monitoringBaseline: 135 bpm, Variability: Good {> 6 bpm), Accelerations: Reactive, and Decelerations: Absent Uterine activity: occasional uc's  Cervix: 1/50/-3  Prenatal labs: ABO, Rh: --/--/PENDING (08/29 1210) Antibody: PENDING (08/29 1210) Rubella: 1.64 (02/09 1007) RPR: Non Reactive (06/05 0811)  HBsAg: Negative (02/09 1007)  HIV: Non Reactive (06/05 0811)  GBS: Positive/-- (02/09 0000)   Prenatal Transfer Tool  Maternal Diabetes: Yes:  Diabetes Type:  Diet controlled Genetic Screening: Normal Maternal Ultrasounds/Referrals: Normal Fetal Ultrasounds or other Referrals:  None Maternal Substance Abuse:  No Significant Maternal Medications:  None Significant Maternal Lab Results: Group B Strep positive  Results for orders placed or performed during the hospital encounter of 01/23/22 (from the past 24 hour(s))  Type and screen   Collection Time: 01/23/22 12:10 PM  Result Value Ref Range   ABO/RH(D) PENDING    Antibody Screen PENDING    Sample Expiration      01/26/2022,2359 Performed at [facility], [address] [place], [place]     Patient Active Problem List   Diagnosis Date Noted   Anemia in pregnancy 11/04/2021   GDM (gestational diabetes mellitus) 10/31/2021   ASCUS with positive high risk HPV  cervical 07/12/2021   History of cesarean section 07/06/2021   GBS bacteriuria 07/06/2021   Supervision of other normal pregnancy, antepartum 06/22/2021    Assessment/Plan:  Mindy Key is a 21 y.o. G3P1011 at [redacted]w[redacted]d here for IOL for A1GDM  #Labor: Patient had previous c/s in 2016 under general anesthesia. Patient states she was contracting and her water broke on exam and was found to be breech.  She reports she was rushed to the OR after.   Lengthy conversation about IOL with previous c/s. Options for IOL reviewed including FB and pitocin. Initially patient stated she does want a vaginal delivery.   CNM checked cervix and patient very tearful. She reports she is very scared of a vaginal delivery. Lengthy discussion about risks and benefits of both TOLAC and repeat c/s. Patient states she does not want a vaginal delivery and desires repeat c/s.   Dr. Adrian Blackwater notified of patient desire for repeat c/s. Patient NPO since 2200. MD will come consent patient for c/s.   #FWB: Cat 1 #ID:  GBS pos #MOF: Breast #MOC: natural family planning #Circ:  N/A  Rolm Bookbinder, CNM  01/23/2022, 12:48 PM

## 2022-01-23 NOTE — Anesthesia Procedure Notes (Signed)
Spinal  Patient location during procedure: OR Start time: 01/23/2022 2:00 PM End time: 01/23/2022 2:15 PM Reason for block: surgical anesthesia Staffing Performed: anesthesiologist  Anesthesiologist: Leonides Grills, MD Performed by: Leonides Grills, MD Authorized by: Leonides Grills, MD   Preanesthetic Checklist Completed: patient identified, IV checked, risks and benefits discussed, surgical consent, monitors and equipment checked, pre-op evaluation and timeout performed Spinal Block Patient position: sitting Prep: DuraPrep Patient monitoring: cardiac monitor, continuous pulse ox and blood pressure Approach: midline Location: L4-5 Injection technique: single-shot Needle Needle type: Pencan  Needle gauge: 25 G Needle length: 9 cm Assessment Sensory level: T10 Events: CSF return Additional Notes Functioning IV was confirmed and monitors were applied. Sterile prep and drape, including hand hygiene and sterile gloves were used. The patient was positioned and the spine was prepped. The skin was anesthetized with lidocaine.  Unable to obtain CSF despite numerous attempts with both 24 gauge PENCAN and 22 gauge Quincke. Decision made to use ESPOCAN. Loss of resistance encountered. PENCAN threaded and CSF obtained. Free flow of clear CSF was obtained prior to injecting local anesthetic into the CSF.  The spinal needle aspirated freely following injection.  The needle was carefully withdrawn.  The patient tolerated the procedure well.

## 2022-01-23 NOTE — Plan of Care (Signed)
  Problem: Nutrition: Goal: Adequate nutrition will be maintained Outcome: Progressing   Problem: Coping: Goal: Level of anxiety will decrease Outcome: Progressing   Problem: Pain Managment: Goal: General experience of comfort will improve Outcome: Progressing   

## 2022-01-23 NOTE — Transfer of Care (Signed)
Immediate Anesthesia Transfer of Care Note  Patient: Mindy Key  Procedure(s) Performed: CESAREAN SECTION  Patient Location: PACU  Anesthesia Type:Spinal  Level of Consciousness: awake, alert  and oriented  Airway & Oxygen Therapy: Patient Spontanous Breathing  Post-op Assessment: Report given to RN and Post -op Vital signs reviewed and stable  Post vital signs: Reviewed and stable  Last Vitals:  Vitals Value Taken Time  BP 105/54 01/23/22 1532  Temp    Pulse 60 01/23/22 1536  Resp 12 01/23/22 1536  SpO2 97 % 01/23/22 1536  Vitals shown include unvalidated device data.  Last Pain:  Vitals:   01/23/22 1157  TempSrc: Oral  PainSc:          Complications: No notable events documented.

## 2022-01-24 ENCOUNTER — Encounter (HOSPITAL_COMMUNITY): Payer: Self-pay | Admitting: Family Medicine

## 2022-01-24 LAB — CBC
HCT: 27.1 % — ABNORMAL LOW (ref 36.0–46.0)
Hemoglobin: 8.2 g/dL — ABNORMAL LOW (ref 12.0–15.0)
MCH: 23.3 pg — ABNORMAL LOW (ref 26.0–34.0)
MCHC: 30.3 g/dL (ref 30.0–36.0)
MCV: 77 fL — ABNORMAL LOW (ref 80.0–100.0)
Platelets: 151 10*3/uL (ref 150–400)
RBC: 3.52 MIL/uL — ABNORMAL LOW (ref 3.87–5.11)
RDW: 16.9 % — ABNORMAL HIGH (ref 11.5–15.5)
WBC: 18.7 10*3/uL — ABNORMAL HIGH (ref 4.0–10.5)
nRBC: 0 % (ref 0.0–0.2)

## 2022-01-24 MED ORDER — SODIUM CHLORIDE 0.9 % IV SOLN
500.0000 mg | Freq: Once | INTRAVENOUS | Status: AC
  Administered 2022-01-24: 500 mg via INTRAVENOUS
  Filled 2022-01-24: qty 500

## 2022-01-24 NOTE — Lactation Note (Signed)
This note was copied from a baby's chart. Lactation Consultation Note  Patient Name: Girl Miley Blanchett FVCBS'W Date: 01/24/2022 Reason for consult: Mother's request;Term;Maternal endocrine disorder Age:23 hours  Mom wanted assistance latching. Encouraged mom to get comfortable in the bed.  Positioned baby in football hold. Baby latched well. No swallows heard. Baby needed stimulation to keep feeding. Praised mom. Encouraged to call for assistance as needed.  Maternal Data Has patient been taught Hand Expression?: Yes  Feeding Mother's Current Feeding Choice: Breast Milk and Formula  LATCH Score Latch: Grasps breast easily, tongue down, lips flanged, rhythmical sucking.  Audible Swallowing: None  Type of Nipple: Everted at rest and after stimulation (short shaft)  Comfort (Breast/Nipple): Soft / non-tender  Hold (Positioning): Assistance needed to correctly position infant at breast and maintain latch.  LATCH Score: 7   Lactation Tools Discussed/Used    Interventions Interventions: Breast feeding basics reviewed;Assisted with latch;Skin to skin;Breast massage;Hand express;Breast compression;Adjust position;Support pillows;Position options  Discharge    Consult Status Consult Status: Follow-up Date: 01/25/22 Follow-up type: In-patient    Charyl Dancer 01/24/2022, 6:51 AM
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
## 2022-01-26 ENCOUNTER — Other Ambulatory Visit: Payer: Self-pay

## 2022-01-26 ENCOUNTER — Other Ambulatory Visit (HOSPITAL_COMMUNITY): Payer: Self-pay

## 2022-01-26 MED ORDER — SENNOSIDES-DOCUSATE SODIUM 8.6-50 MG PO TABS
2.0000 | ORAL_TABLET | Freq: Every day | ORAL | 0 refills | Status: DC
  Filled 2022-01-26: qty 30, 15d supply, fill #0

## 2022-01-26 MED ORDER — FERROUS SULFATE 325 (65 FE) MG PO TABS
325.0000 mg | ORAL_TABLET | ORAL | 3 refills | Status: AC
  Filled 2022-01-26: qty 30, 60d supply, fill #0

## 2022-01-26 MED ORDER — ACETAMINOPHEN 500 MG PO TABS
1000.0000 mg | ORAL_TABLET | Freq: Four times a day (QID) | ORAL | 0 refills | Status: AC
  Filled 2022-01-26: qty 30, 4d supply, fill #0

## 2022-01-26 MED ORDER — IBUPROFEN 600 MG PO TABS
600.0000 mg | ORAL_TABLET | Freq: Four times a day (QID) | ORAL | 0 refills | Status: DC
  Filled 2022-01-26: qty 30, 8d supply, fill #0

## 2022-01-26 MED ORDER — OXYCODONE HCL 5 MG PO TABS
5.0000 mg | ORAL_TABLET | ORAL | 0 refills | Status: DC | PRN
  Filled 2022-01-26: qty 30, 3d supply, fill #0

## 2022-01-27 LAB — TYPE AND SCREEN
ABO/RH(D): O POS
Antibody Screen: POSITIVE
Unit division: 0
Unit division: 0

## 2022-01-27 LAB — BPAM RBC
Blood Product Expiration Date: 202309302359
Blood Product Expiration Date: 202310012359
ISSUE DATE / TIME: 202308261827
Unit Type and Rh: 5100
Unit Type and Rh: 5100

## 2022-01-28 LAB — CULTURE, BLOOD (ROUTINE X 2)
Culture: NO GROWTH
Culture: NO GROWTH
Special Requests: ADEQUATE
Special Requests: ADEQUATE

## 2022-02-01 ENCOUNTER — Ambulatory Visit: Payer: Medicaid Other

## 2022-02-01 ENCOUNTER — Telehealth (HOSPITAL_COMMUNITY): Payer: Self-pay | Admitting: *Deleted

## 2022-02-01 NOTE — Telephone Encounter (Signed)
Phone # not in service.  Duffy Rhody, RN 02-01-2022 at 2:38pm

## 2022-03-05 ENCOUNTER — Ambulatory Visit: Payer: Medicaid Other | Admitting: Obstetrics and Gynecology

## 2022-04-06 ENCOUNTER — Ambulatory Visit: Payer: Medicaid Other | Admitting: Advanced Practice Midwife

## 2022-06-19 ENCOUNTER — Encounter: Payer: Self-pay | Admitting: Obstetrics and Gynecology

## 2022-06-19 ENCOUNTER — Ambulatory Visit (INDEPENDENT_AMBULATORY_CARE_PROVIDER_SITE_OTHER): Payer: Medicaid Other | Admitting: Obstetrics and Gynecology

## 2022-06-19 VITALS — BP 119/57 | HR 93

## 2022-06-19 DIAGNOSIS — N644 Mastodynia: Secondary | ICD-10-CM | POA: Diagnosis not present

## 2022-06-19 NOTE — Progress Notes (Signed)
    GYNECOLOGY VISIT  Patient name: Mindy Key MRN [id number]  Date of birth: [birth year] Chief Complaint:   Gynecologic Exam   History:  Mindy Key is a 22 y.o. [phone] being seen today for breast pain x3 months and very painful and stopped breastfeeding due to the pain. Sharp pain in back and side as well  . Menses have resumed. Stopped breastfeeding. Bilateral breast pain  Past Medical History:  Diagnosis Date   Anemia     Past Surgical History:  Procedure Laterality Date   CESAREAN SECTION     CESAREAN SECTION N/A 01/23/2022   Procedure: CESAREAN SECTION;  Surgeon: Truett Mainland, DO;  Location: [facility] LD ORS;  Service: Obstetrics;  Laterality: N/A;    The following portions of the patient's history were reviewed and updated as appropriate: allergies, current medications, past family history, past medical history, past social history, past surgical history and problem list.   Health Maintenance:   Last pap     Component Value Date/Time   DIAGPAP (A) 07/06/2021 0934    - Atypical squamous cells of undetermined significance (ASC-US)   HPVHIGH Positive (A) 07/06/2021 0934   ADEQPAP  07/06/2021 0934    Satisfactory for evaluation; transformation zone component PRESENT.    Review of Systems:  Pertinent items are noted in HPI. Comprehensive review of systems was otherwise negative.   Objective:  Physical Exam BP (!) 119/57   Pulse 93   LMP 06/16/2022   Breastfeeding Yes    Physical Exam Vitals and nursing note reviewed. Exam conducted with a chaperone present.  Constitutional:      Appearance: Normal appearance.  HENT:     Head: Normocephalic and atraumatic.  Pulmonary:     Effort: Pulmonary effort is normal.  Chest:  Breasts:    Right: Normal.     Left: Normal.  Skin:    General: Skin is warm and dry.  Neurological:     General: No focal deficit present.     Mental Status: She is alert.  Psychiatric:        Mood and Affect: Mood normal.        Behavior:  Behavior normal.        Thought Content: Thought content normal.        Judgment: Judgment normal.      Labs and Imaging No results found.     Assessment & Plan:   1. Mastalgia Reassuring bilateral tenderness. Exam without abnormal findigns. Recommend conserviatve treatment with NSAIDS, APAP, well fitting bra, and ice. Follow up symptoms with annual/pap visit    Routine preventative health maintenance measures emphasized.  Darliss Cheney, MD [facility]

## 2022-07-10 ENCOUNTER — Ambulatory Visit: Payer: PRIVATE HEALTH INSURANCE | Admitting: Obstetrics and Gynecology

## 2022-07-31 ENCOUNTER — Ambulatory Visit: Payer: No Typology Code available for payment source

## 2022-10-08 ENCOUNTER — Ambulatory Visit: Payer: No Typology Code available for payment source

## 2022-10-28 ENCOUNTER — Other Ambulatory Visit: Payer: Self-pay

## 2022-10-28 ENCOUNTER — Encounter (HOSPITAL_COMMUNITY): Payer: Self-pay | Admitting: Emergency Medicine

## 2022-10-28 ENCOUNTER — Emergency Department (HOSPITAL_COMMUNITY)
Admission: EM | Admit: 2022-10-28 | Discharge: 2022-10-28 | Disposition: A | Discharge status: Home / Self Care | Payer: No Typology Code available for payment source | Attending: Emergency Medicine | Admitting: Emergency Medicine

## 2022-10-28 ENCOUNTER — Emergency Department (HOSPITAL_COMMUNITY): Payer: No Typology Code available for payment source

## 2022-10-28 DIAGNOSIS — W3400XA Accidental discharge from unspecified firearms or gun, initial encounter: Secondary | ICD-10-CM | POA: Diagnosis not present

## 2022-10-28 DIAGNOSIS — Z3A01 Less than 8 weeks gestation of pregnancy: Secondary | ICD-10-CM

## 2022-10-28 DIAGNOSIS — O26891 Other specified pregnancy related conditions, first trimester: Secondary | ICD-10-CM | POA: Insufficient documentation

## 2022-10-28 DIAGNOSIS — O9A211 Injury, poisoning and certain other consequences of external causes complicating pregnancy, first trimester: Secondary | ICD-10-CM | POA: Insufficient documentation

## 2022-10-28 DIAGNOSIS — S71141A Puncture wound with foreign body, right thigh, initial encounter: Secondary | ICD-10-CM | POA: Insufficient documentation

## 2022-10-28 DIAGNOSIS — S41032A Puncture wound without foreign body of left shoulder, initial encounter: Secondary | ICD-10-CM | POA: Insufficient documentation

## 2022-10-28 DIAGNOSIS — S71131A Puncture wound without foreign body, right thigh, initial encounter: Secondary | ICD-10-CM

## 2022-10-28 LAB — COMPREHENSIVE METABOLIC PANEL
ALT: 12 U/L (ref 0–44)
AST: 16 U/L (ref 15–41)
Albumin: 3.7 g/dL (ref 3.5–5.0)
Alkaline Phosphatase: 63 U/L (ref 38–126)
Anion gap: 10 (ref 5–15)
BUN: 6 mg/dL (ref 6–20)
CO2: 20 mmol/L — ABNORMAL LOW (ref 22–32)
Calcium: 9.1 mg/dL (ref 8.9–10.3)
Chloride: 106 mmol/L (ref 98–111)
Creatinine, Ser: 0.77 mg/dL (ref 0.44–1.00)
GFR, Estimated: >60 mL/min (ref >60)
Glucose, Bld: 110 mg/dL — ABNORMAL HIGH (ref 70–99)
Potassium: 3.2 mmol/L — ABNORMAL LOW (ref 3.5–5.1)
Sodium: 136 mmol/L (ref 135–145)
Total Bilirubin: 0.7 mg/dL (ref 0.3–1.2)
Total Protein: 7.5 g/dL (ref 6.5–8.1)

## 2022-10-28 LAB — I-STAT CHEM 8, ED
BUN: 5 mg/dL — ABNORMAL LOW (ref 6–20)
Calcium, Ion: 1.17 mmol/L (ref 1.15–1.40)
Chloride: 105 mmol/L (ref 98–111)
Creatinine, Ser: 0.6 mg/dL (ref 0.44–1.00)
Glucose, Bld: 109 mg/dL — ABNORMAL HIGH (ref 70–99)
HCT: 37 % (ref 36.0–46.0)
Hemoglobin: 12.6 g/dL (ref 12.0–15.0)
Potassium: 3.3 mmol/L — ABNORMAL LOW (ref 3.5–5.1)
Sodium: 140 mmol/L (ref 135–145)
TCO2: 21 mmol/L — ABNORMAL LOW (ref 22–32)

## 2022-10-28 LAB — CBC
HCT: 35.2 % — ABNORMAL LOW (ref 36.0–46.0)
Hemoglobin: 11.6 g/dL — ABNORMAL LOW (ref 12.0–15.0)
MCH: 28.6 pg (ref 26.0–34.0)
MCHC: 33 g/dL (ref 30.0–36.0)
MCV: 86.7 fL (ref 80.0–100.0)
Platelets: 258 10*3/uL (ref 150–400)
RBC: 4.06 MIL/uL (ref 3.87–5.11)
RDW: 14.9 % (ref 11.5–15.5)
WBC: 12.4 10*3/uL — ABNORMAL HIGH (ref 4.0–10.5)
nRBC: 0 % (ref 0.0–0.2)

## 2022-10-28 LAB — PROTIME-INR
INR: 1 (ref 0.8–1.2)
Prothrombin Time: 13.7 seconds (ref 11.4–15.2)

## 2022-10-28 LAB — ETHANOL: Alcohol, Ethyl (B): <10 mg/dL (ref <10)

## 2022-10-28 LAB — I-STAT BETA HCG BLOOD, ED (MC, WL, AP ONLY): I-stat hCG, quantitative: 720.5 m[IU]/mL — ABNORMAL HIGH (ref <5)

## 2022-10-28 LAB — LACTIC ACID, PLASMA: Lactic Acid, Venous: 1.9 mmol/L (ref 0.5–1.9)

## 2022-10-28 LAB — HCG, QUANTITATIVE, PREGNANCY: hCG, Beta Chain, Quant, S: 958 m[IU]/mL — ABNORMAL HIGH (ref <5)

## 2022-10-28 LAB — SAMPLE TO BLOOD BANK

## 2022-10-28 MED ORDER — FENTANYL CITRATE PF 50 MCG/ML IJ SOSY
50.0000 ug | PREFILLED_SYRINGE | Freq: Once | INTRAMUSCULAR | Status: AC
  Administered 2022-10-28: 50 ug via INTRAVENOUS
  Filled 2022-10-28: qty 1

## 2022-10-28 NOTE — ED Notes (Signed)
Trauma Event Note   ABI:   BP R arm 129/86  BP R ankle 131/104  ABI 1.01   BP L arm 122/63  BP 131/107  ABI 1.07   Mindy Key O Mindy Key  Trauma Response RN  Please call TRN at [phone] for further assistance.
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
## 2022-10-28 NOTE — Progress Notes (Signed)
Orthopedic Tech Progress Note Patient Details:  Mindy Key [birth year] [id number]  Patient ID: Mindy Key, female   DOB: Mar 15, 2001, 22 y.o.   MRN: [id number] I attended trauma page. Trinna Post 10/28/2022, 3:45 AM
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
## 2022-10-28 NOTE — Discharge Instructions (Signed)
As we discussed, you need to get a repeat pregnancy hormone level checked in 48 hours.

## 2023-06-06 ENCOUNTER — Ambulatory Visit: Payer: No Typology Code available for payment source | Admitting: Obstetrics and Gynecology

## 2023-06-19 IMAGING — US US OB COMP LESS 14 WK
1 series · 15 of 28 positions shown · non-contrast
Comparison: None.

CLINICAL DATA: Epigastric pain, pelvic pain

EXAM:
OBSTETRIC <14 WK ULTRASOUND
TECHNIQUE: Transabdominal ultrasound was performed for evaluation of the
gestation as well as the maternal uterus and adnexal regions.

[Series 1: us ob comp less 14 wk · 15 of 52 slices shown]
[im 1/52]
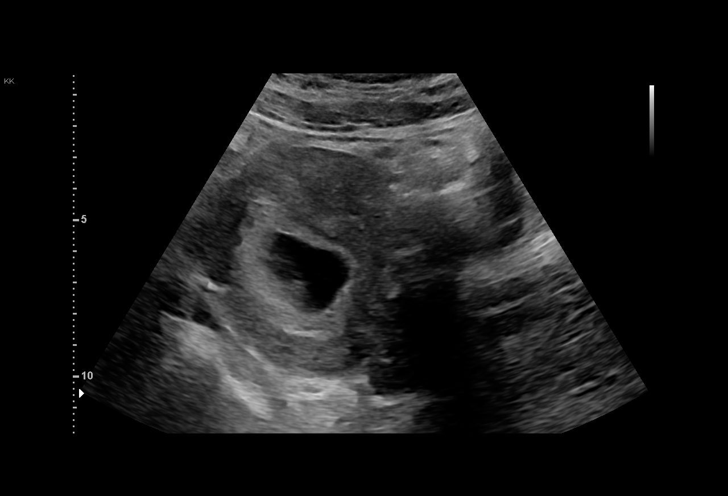
[im 4/52]
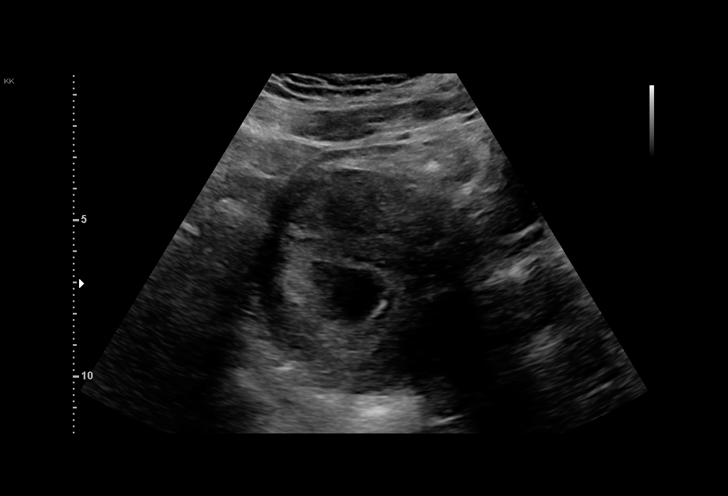
[im 8/52]
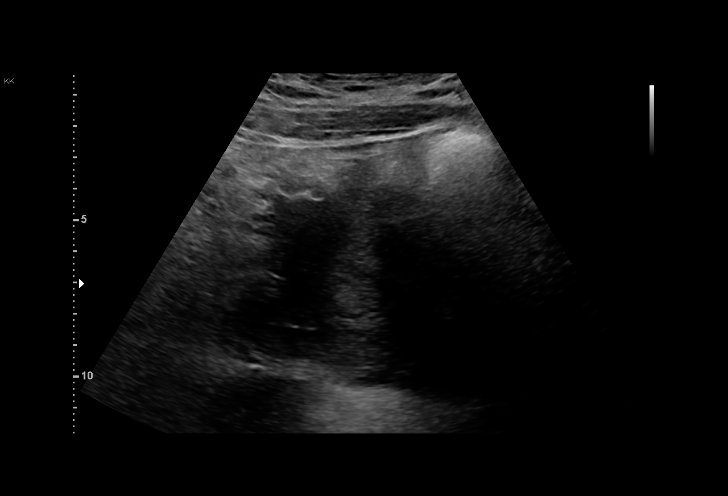
[im 12/52]
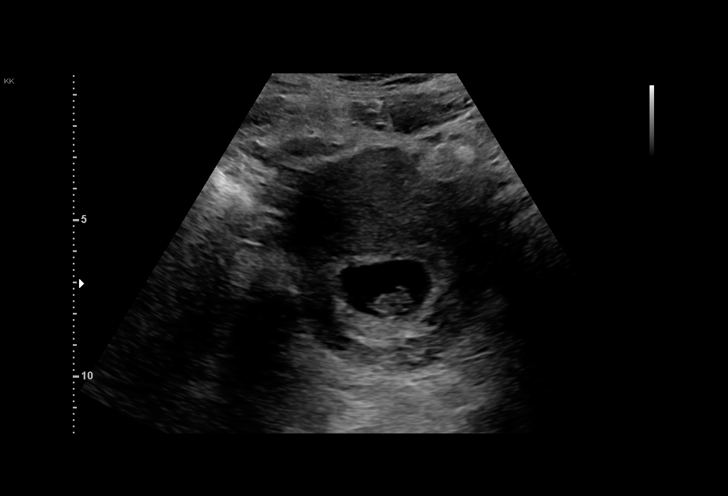
[im 16/52]
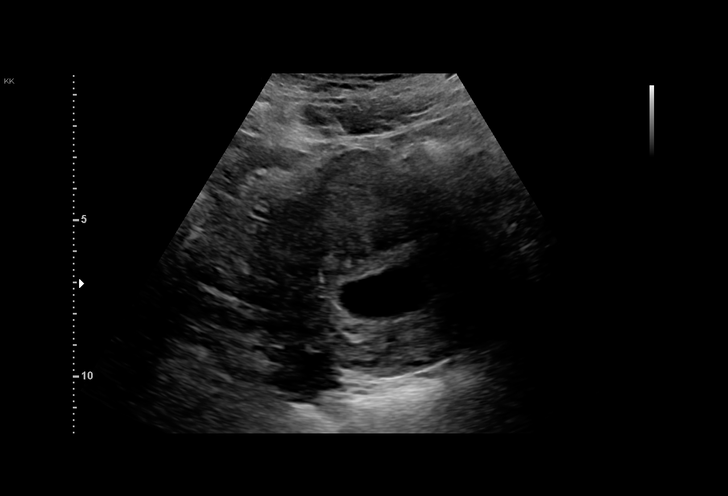
[im 19/52]
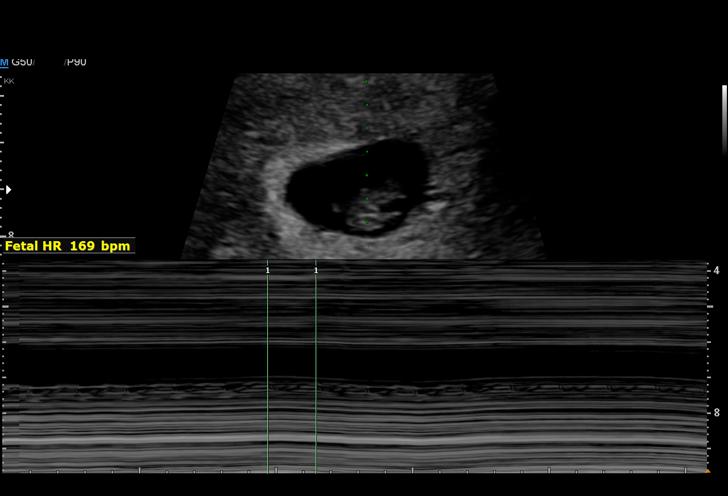
[im 23/52]
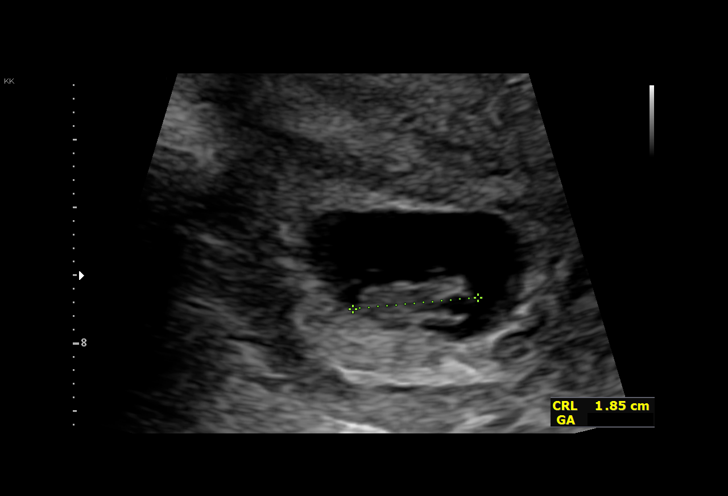
[im 27/52]
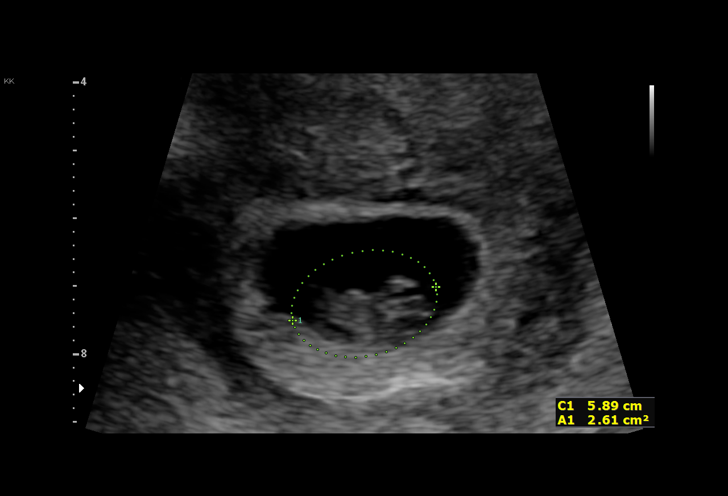
[im 29/52]
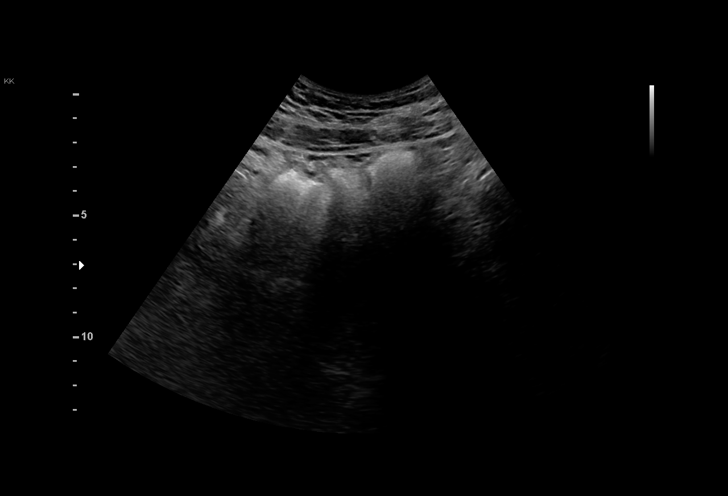
[im 33/52]
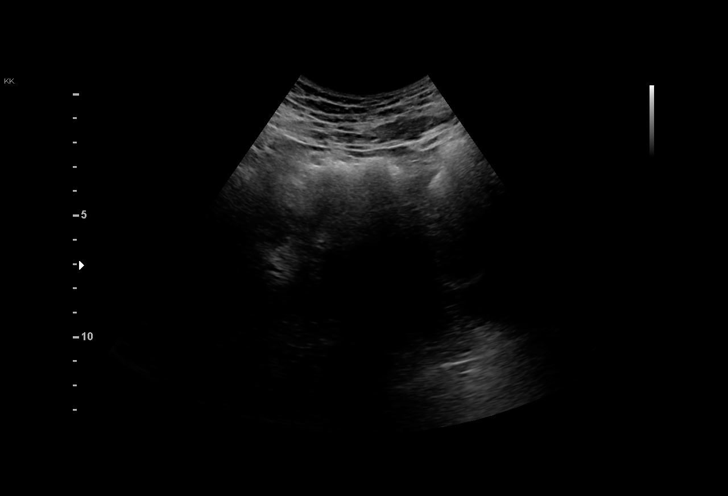
[im 36/52]
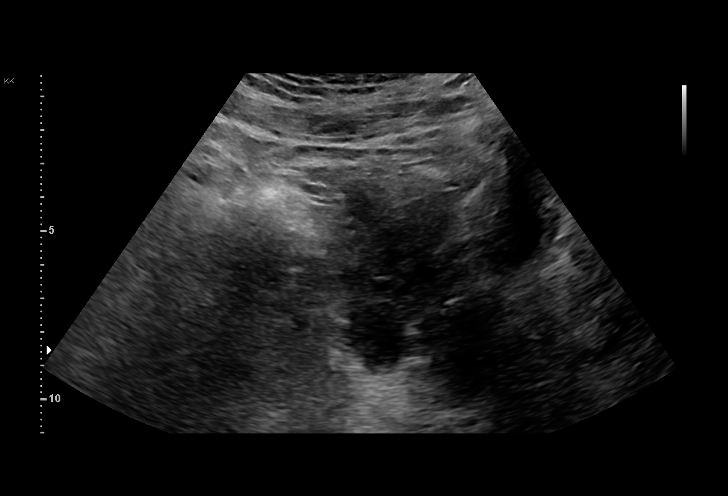
[im 40/52]
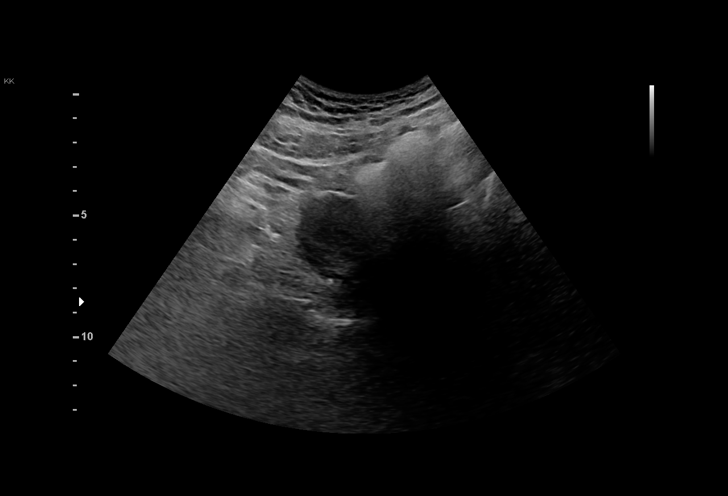
[im 44/52]
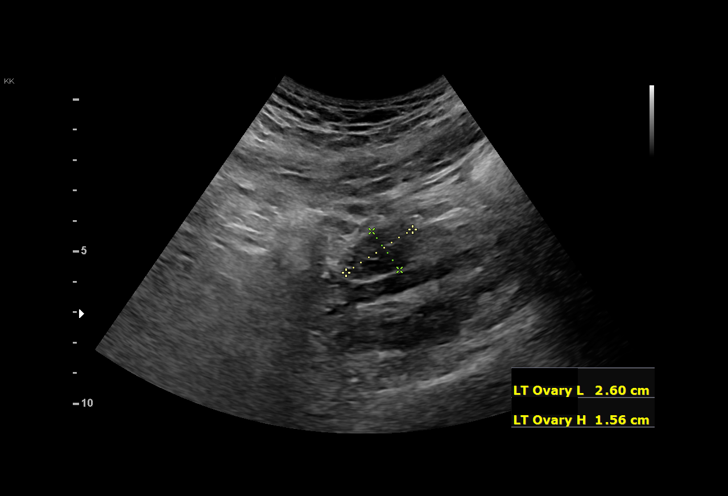
[im 48/52]
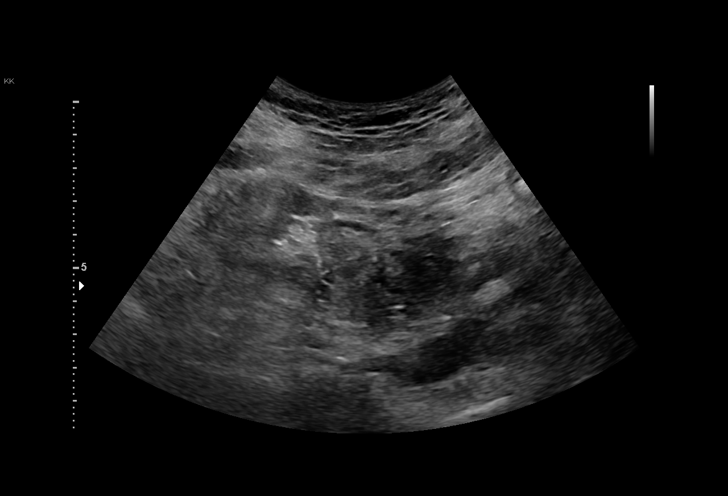
[im 52/52]
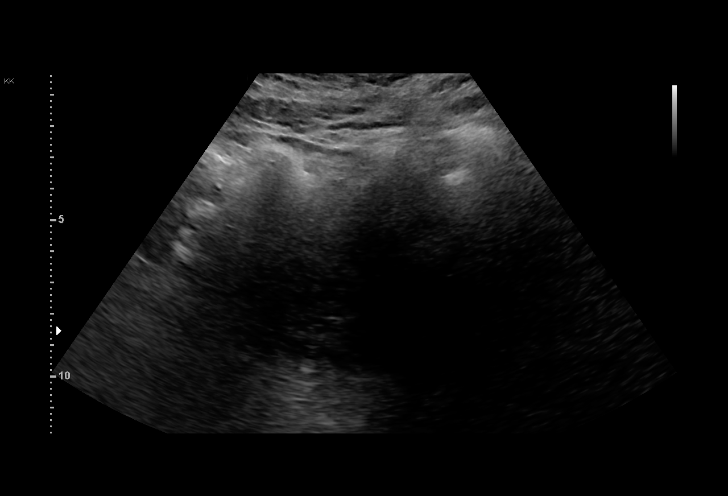

[15 of 28 positions shown; findings below may reference images not displayed]

FINDINGS: Intrauterine gestational sac: Single

Yolk sac:  Visualized.

Embryo:  Visualized.

Cardiac Activity: Visualized.

Heart Rate: 169 bpm

MSD:    mm    w     d

CRL:   18.7 mm   8 w 2 d                  US EDC: 01/30/2022

Subchorionic hemorrhage:  None visualized.

Maternal uterus/adnexae: No adnexal mass or free fluid.
IMPRESSION: Eight week 2 day intrauterine pregnancy. Fetal heart rate 169 beats
per minute. No acute maternal findings.

## 2023-07-25 NOTE — Progress Notes (Unsigned)
   GYNECOLOGY PROGRESS NOTE  History:  23 y.o. [id number] presents to [facility] office today for irregular period. She did not have a period for three months. Has always had irregular periods. Will sometimes go 1 month or 2 month without period. LMP 06/06/2023  changing pad every three hours.  Was previously on depo, did not like irregular bleeding. No excessive hair growth.Denies spotting or bleeding outside of cycle.   The following portions of the patient's history were reviewed and updated as appropriate: allergies, current medications, past family history, past medical history, past social history, past surgical history and problem list. Last pap smear on 2023 was ASCUS, pos HRHPV.  Health Maintenance Due  Topic Date Due   HPV VACCINES (1 - 3-dose series) Never done   INFLUENZA VACCINE  12/27/2022   COVID-19 Vaccine (1 - 2024-25 season) Never done     Review of Systems:  Pertinent items are noted in HPI.   Objective:  Physical Exam currently breastfeeding. VS reviewed, nursing note reviewed,  Constitutional: well developed, well nourished, no distress HEENT: normocephalic CV: normal rate Pulm/chest wall: normal effort Breast Exam: deferred Abdomen: soft Neuro: alert and oriented  Skin: warm, dry Psych: affect normal Pelvic: normal appearing vulva with no masses, tenderness or lesions  VAGINA: normal appearing vagina with normal color and discharge, no lesions  CERVIX: normal appearing cervix without discharge or lesions, no CMT  Thin prep pap is done   Extremities:  No swelling or varicosities noted    Assessment & Plan:  1. Irregular periods (Primary) Desires contraception today to regulate cycle. UPT negative, discussed UPT in two weeks unless cycle starts before then. To start birth control first day of menstrual cycle. Discussed contraceptive options, would like to try patch. Discussed mechanism of patch, timing and changing patch.   2. ASCUS with positive high  risk HPV cervical Repeat pap today - Cytology - PAP( [place])  Albertine Grates, FNP

## 2023-07-26 ENCOUNTER — Other Ambulatory Visit: Payer: Self-pay

## 2023-07-26 ENCOUNTER — Other Ambulatory Visit (HOSPITAL_COMMUNITY)
Admission: RE | Admit: 2023-07-26 | Discharge: 2023-07-26 | Disposition: A | Discharge status: Home / Self Care | Source: Ambulatory Visit | Attending: Obstetrics and Gynecology | Admitting: Obstetrics and Gynecology

## 2023-07-26 ENCOUNTER — Ambulatory Visit (INDEPENDENT_AMBULATORY_CARE_PROVIDER_SITE_OTHER): Payer: No Typology Code available for payment source | Admitting: Obstetrics and Gynecology

## 2023-07-26 ENCOUNTER — Encounter: Payer: Self-pay | Admitting: Obstetrics and Gynecology

## 2023-07-26 VITALS — BP 111/68 | HR 67 | Wt 181.0 lb

## 2023-07-26 DIAGNOSIS — R87810 Cervical high risk human papillomavirus (HPV) DNA test positive: Secondary | ICD-10-CM | POA: Diagnosis not present

## 2023-07-26 DIAGNOSIS — R87610 Atypical squamous cells of undetermined significance on cytologic smear of cervix (ASC-US): Secondary | ICD-10-CM | POA: Insufficient documentation

## 2023-07-26 DIAGNOSIS — N926 Irregular menstruation, unspecified: Secondary | ICD-10-CM | POA: Diagnosis not present

## 2023-07-30 LAB — POCT PREGNANCY, URINE: Preg Test, Ur: NEGATIVE

## 2023-07-30 MED ORDER — TWIRLA 120-30 MCG/24HR TD PTWK
MEDICATED_PATCH | TRANSDERMAL | 3 refills | Status: AC
Start: 2023-07-30 — End: ?

## 2023-08-01 LAB — CYTOLOGY - PAP
Adequacy: ABSENT
Chlamydia: NEGATIVE
Comment: NEGATIVE
Comment: NORMAL
Neisseria Gonorrhea: NEGATIVE

## 2024-03-06 ENCOUNTER — Ambulatory Visit: Admitting: Obstetrics and Gynecology

## 2024-03-18 ENCOUNTER — Other Ambulatory Visit: Payer: Self-pay

## 2024-03-18 ENCOUNTER — Other Ambulatory Visit (HOSPITAL_COMMUNITY)
Admission: RE | Admit: 2024-03-18 | Discharge: 2024-03-18 | Disposition: A | Discharge status: Home / Self Care | Source: Ambulatory Visit | Attending: Family Medicine | Admitting: Family Medicine

## 2024-03-18 ENCOUNTER — Ambulatory Visit: Admitting: *Deleted

## 2024-03-18 VITALS — BP 113/80 | HR 102 | Ht 62.0 in | Wt 167.7 lb

## 2024-03-18 DIAGNOSIS — Z9189 Other specified personal risk factors, not elsewhere classified: Secondary | ICD-10-CM | POA: Insufficient documentation

## 2024-03-18 DIAGNOSIS — N898 Other specified noninflammatory disorders of vagina: Secondary | ICD-10-CM | POA: Insufficient documentation
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
## 2024-03-19 ENCOUNTER — Ambulatory Visit: Payer: Self-pay | Admitting: Obstetrics and Gynecology

## 2024-03-19 ENCOUNTER — Other Ambulatory Visit: Payer: Self-pay | Admitting: *Deleted

## 2024-03-19 DIAGNOSIS — Z9189 Other specified personal risk factors, not elsewhere classified: Secondary | ICD-10-CM

## 2024-03-19 DIAGNOSIS — A749 Chlamydial infection, unspecified: Secondary | ICD-10-CM

## 2024-03-19 DIAGNOSIS — B3731 Acute candidiasis of vulva and vagina: Secondary | ICD-10-CM

## 2024-03-19 DIAGNOSIS — B9689 Other specified bacterial agents as the cause of diseases classified elsewhere: Secondary | ICD-10-CM

## 2024-03-19 DIAGNOSIS — N898 Other specified noninflammatory disorders of vagina: Secondary | ICD-10-CM

## 2024-03-19 LAB — CERVICOVAGINAL ANCILLARY ONLY
Bacterial Vaginitis (gardnerella): POSITIVE — AB
Candida Glabrata: NEGATIVE
Candida Vaginitis: POSITIVE — AB
Chlamydia: POSITIVE — AB
Comment: NEGATIVE
Comment: NEGATIVE
Comment: NEGATIVE
Comment: NEGATIVE
Comment: NEGATIVE
Comment: NORMAL
Neisseria Gonorrhea: NEGATIVE
Trichomonas: NEGATIVE

## 2024-03-19 MED ORDER — METRONIDAZOLE 500 MG PO TABS
500.0000 mg | ORAL_TABLET | Freq: Two times a day (BID) | ORAL | 0 refills | Status: AC
Start: 2024-03-19 — End: 2024-03-26

## 2024-03-19 MED ORDER — DOXYCYCLINE HYCLATE 100 MG PO CAPS
100.0000 mg | ORAL_CAPSULE | Freq: Two times a day (BID) | ORAL | 1 refills | Status: AC
Start: 2024-03-19 — End: 2024-03-26

## 2024-03-19 MED ORDER — FLUCONAZOLE 150 MG PO TABS
150.0000 mg | ORAL_TABLET | ORAL | 3 refills | Status: AC
Start: 2024-03-19 — End: ?

## 2024-03-23 ENCOUNTER — Encounter: Payer: Self-pay | Admitting: *Deleted

## 2024-03-23 NOTE — Progress Notes (Signed)
 Per results report patient positive for Chlamydia. She has been notifed of results and treatment by provider by MyChart message that she has read. Confidential Communicable report completed and faxed to [facility] per protocol. [place] Skip PEAK

## 2024-03-24 ENCOUNTER — Other Ambulatory Visit

## 2024-03-24 ENCOUNTER — Other Ambulatory Visit: Payer: Self-pay

## 2024-03-24 DIAGNOSIS — A749 Chlamydial infection, unspecified: Secondary | ICD-10-CM

## 2024-03-24 DIAGNOSIS — Z9189 Other specified personal risk factors, not elsewhere classified: Secondary | ICD-10-CM

## 2024-03-25 LAB — HIV ANTIBODY (ROUTINE TESTING W REFLEX): HIV Screen 4th Generation wRfx: NONREACTIVE

## 2024-03-26 ENCOUNTER — Ambulatory Visit: Payer: Self-pay | Admitting: Obstetrics and Gynecology

## 2024-06-25 ENCOUNTER — Ambulatory Visit: Payer: Self-pay | Admitting: Obstetrics and Gynecology

## 2024-08-11 ENCOUNTER — Ambulatory Visit: Admitting: Certified Nurse Midwife
